# Patient Record
Sex: Female | Born: 1975 | Race: White | Hispanic: No | Marital: Single
Health system: Southern US, Community
[De-identification: ages and names within clinical notes are randomized; demographics above are authoritative.]

## PROBLEM LIST (undated history)

## (undated) DIAGNOSIS — S7291XA Unspecified fracture of right femur, initial encounter for closed fracture: Secondary | ICD-10-CM

## (undated) DIAGNOSIS — S42202A Unspecified fracture of upper end of left humerus, initial encounter for closed fracture: Secondary | ICD-10-CM

## (undated) DIAGNOSIS — S52371A Galeazzi's fracture of right radius, initial encounter for closed fracture: Secondary | ICD-10-CM

---
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
## 2019-02-09 ENCOUNTER — Emergency Department (HOSPITAL_COMMUNITY): Payer: PRIVATE HEALTH INSURANCE

## 2019-02-09 ENCOUNTER — Inpatient Hospital Stay (HOSPITAL_COMMUNITY)
Admission: EM | Admit: 2019-02-09 | Discharge: 2019-02-19 | DRG: 956 | Disposition: A | Discharge status: Rehabilitation Facility | Payer: PRIVATE HEALTH INSURANCE | Attending: General Surgery | Admitting: General Surgery

## 2019-02-09 ENCOUNTER — Inpatient Hospital Stay (HOSPITAL_COMMUNITY): Payer: PRIVATE HEALTH INSURANCE | Admitting: Certified Registered Nurse Anesthetist

## 2019-02-09 ENCOUNTER — Inpatient Hospital Stay (HOSPITAL_COMMUNITY): Payer: PRIVATE HEALTH INSURANCE

## 2019-02-09 ENCOUNTER — Encounter (HOSPITAL_COMMUNITY): Payer: Self-pay | Admitting: Certified Registered"

## 2019-02-09 ENCOUNTER — Encounter (HOSPITAL_COMMUNITY): Admission: EM | Disposition: A | Discharge status: Rehabilitation Facility | Payer: Self-pay | Source: Home / Self Care

## 2019-02-09 ENCOUNTER — Other Ambulatory Visit: Payer: Self-pay

## 2019-02-09 DIAGNOSIS — Z6841 Body Mass Index (BMI) 40.0 and over, adult: Secondary | ICD-10-CM | POA: Diagnosis not present

## 2019-02-09 DIAGNOSIS — S52371A Galeazzi's fracture of right radius, initial encounter for closed fracture: Secondary | ICD-10-CM | POA: Diagnosis present

## 2019-02-09 DIAGNOSIS — R0902 Hypoxemia: Secondary | ICD-10-CM

## 2019-02-09 DIAGNOSIS — Z20828 Contact with and (suspected) exposure to other viral communicable diseases: Secondary | ICD-10-CM | POA: Diagnosis present

## 2019-02-09 DIAGNOSIS — S72491A Other fracture of lower end of right femur, initial encounter for closed fracture: Principal | ICD-10-CM | POA: Diagnosis present

## 2019-02-09 DIAGNOSIS — S0003XA Contusion of scalp, initial encounter: Secondary | ICD-10-CM | POA: Diagnosis present

## 2019-02-09 DIAGNOSIS — S36892A Contusion of other intra-abdominal organs, initial encounter: Secondary | ICD-10-CM | POA: Diagnosis present

## 2019-02-09 DIAGNOSIS — S272XXA Traumatic hemopneumothorax, initial encounter: Secondary | ICD-10-CM | POA: Diagnosis present

## 2019-02-09 DIAGNOSIS — I2699 Other pulmonary embolism without acute cor pulmonale: Secondary | ICD-10-CM | POA: Diagnosis not present

## 2019-02-09 DIAGNOSIS — R110 Nausea: Secondary | ICD-10-CM | POA: Diagnosis not present

## 2019-02-09 DIAGNOSIS — E8889 Other specified metabolic disorders: Secondary | ICD-10-CM | POA: Diagnosis present

## 2019-02-09 DIAGNOSIS — S0081XA Abrasion of other part of head, initial encounter: Secondary | ICD-10-CM | POA: Diagnosis present

## 2019-02-09 DIAGNOSIS — S42111A Displaced fracture of body of scapula, right shoulder, initial encounter for closed fracture: Secondary | ICD-10-CM | POA: Diagnosis present

## 2019-02-09 DIAGNOSIS — R52 Pain, unspecified: Secondary | ICD-10-CM

## 2019-02-09 DIAGNOSIS — D6851 Activated protein C resistance: Secondary | ICD-10-CM | POA: Diagnosis not present

## 2019-02-09 DIAGNOSIS — S36113A Laceration of liver, unspecified degree, initial encounter: Secondary | ICD-10-CM | POA: Diagnosis present

## 2019-02-09 DIAGNOSIS — S2241XA Multiple fractures of ribs, right side, initial encounter for closed fracture: Secondary | ICD-10-CM | POA: Diagnosis present

## 2019-02-09 DIAGNOSIS — Z79899 Other long term (current) drug therapy: Secondary | ICD-10-CM | POA: Diagnosis not present

## 2019-02-09 DIAGNOSIS — Z7901 Long term (current) use of anticoagulants: Secondary | ICD-10-CM | POA: Diagnosis not present

## 2019-02-09 DIAGNOSIS — E559 Vitamin D deficiency, unspecified: Secondary | ICD-10-CM | POA: Diagnosis present

## 2019-02-09 DIAGNOSIS — N920 Excessive and frequent menstruation with regular cycle: Secondary | ICD-10-CM | POA: Diagnosis not present

## 2019-02-09 DIAGNOSIS — S27321A Contusion of lung, unilateral, initial encounter: Secondary | ICD-10-CM | POA: Diagnosis present

## 2019-02-09 DIAGNOSIS — S42201A Unspecified fracture of upper end of right humerus, initial encounter for closed fracture: Principal | ICD-10-CM

## 2019-02-09 DIAGNOSIS — S270XXA Traumatic pneumothorax, initial encounter: Secondary | ICD-10-CM

## 2019-02-09 DIAGNOSIS — Z419 Encounter for procedure for purposes other than remedying health state, unspecified: Secondary | ICD-10-CM

## 2019-02-09 DIAGNOSIS — D62 Acute posthemorrhagic anemia: Secondary | ICD-10-CM | POA: Diagnosis not present

## 2019-02-09 DIAGNOSIS — S42202A Unspecified fracture of upper end of left humerus, initial encounter for closed fracture: Secondary | ICD-10-CM | POA: Diagnosis present

## 2019-02-09 DIAGNOSIS — S42292A Other displaced fracture of upper end of left humerus, initial encounter for closed fracture: Secondary | ICD-10-CM | POA: Diagnosis present

## 2019-02-09 DIAGNOSIS — S80812A Abrasion, left lower leg, initial encounter: Secondary | ICD-10-CM | POA: Diagnosis present

## 2019-02-09 DIAGNOSIS — J9690 Respiratory failure, unspecified, unspecified whether with hypoxia or hypercapnia: Secondary | ICD-10-CM | POA: Diagnosis present

## 2019-02-09 DIAGNOSIS — G373 Acute transverse myelitis in demyelinating disease of central nervous system: Secondary | ICD-10-CM | POA: Diagnosis present

## 2019-02-09 DIAGNOSIS — S7291XA Unspecified fracture of right femur, initial encounter for closed fracture: Secondary | ICD-10-CM | POA: Diagnosis present

## 2019-02-09 DIAGNOSIS — T148XXA Other injury of unspecified body region, initial encounter: Secondary | ICD-10-CM

## 2019-02-09 HISTORY — PX: ORIF ULNAR FRACTURE: SHX5417

## 2019-02-09 HISTORY — PX: FEMUR IM NAIL: SHX1597

## 2019-02-09 LAB — URINALYSIS, ROUTINE W REFLEX MICROSCOPIC
Bilirubin Urine: NEGATIVE
Glucose, UA: NEGATIVE mg/dL
Ketones, ur: NEGATIVE mg/dL
Leukocytes,Ua: NEGATIVE
Nitrite: POSITIVE — AB
Protein, ur: 100 mg/dL — AB
Specific Gravity, Urine: >1.046 — ABNORMAL HIGH (ref 1.005–1.030)
pH: 5 (ref 5.0–8.0)

## 2019-02-09 LAB — COMPREHENSIVE METABOLIC PANEL
ALT: 193 U/L — ABNORMAL HIGH (ref 0–44)
AST: 255 U/L — ABNORMAL HIGH (ref 15–41)
Albumin: 3.2 g/dL — ABNORMAL LOW (ref 3.5–5.0)
Alkaline Phosphatase: 70 U/L (ref 38–126)
Anion gap: 10 (ref 5–15)
BUN: 15 mg/dL (ref 6–20)
CO2: 21 mmol/L — ABNORMAL LOW (ref 22–32)
Calcium: 9.1 mg/dL (ref 8.9–10.3)
Chloride: 107 mmol/L (ref 98–111)
Creatinine, Ser: 0.79 mg/dL (ref 0.44–1.00)
GFR calc Af Amer: >60 mL/min (ref >60)
GFR calc non Af Amer: >60 mL/min (ref >60)
Glucose, Bld: 143 mg/dL — ABNORMAL HIGH (ref 70–99)
Potassium: 4.3 mmol/L (ref 3.5–5.1)
Sodium: 138 mmol/L (ref 135–145)
Total Bilirubin: 0.2 mg/dL — ABNORMAL LOW (ref 0.3–1.2)
Total Protein: 6.5 g/dL (ref 6.5–8.1)

## 2019-02-09 LAB — CBC
HCT: 38.9 % (ref 36.0–46.0)
HCT: 37 % (ref 36.0–46.0)
HCT: 35.6 % — ABNORMAL LOW (ref 36.0–46.0)
Hemoglobin: 12.3 g/dL (ref 12.0–15.0)
Hemoglobin: 11.6 g/dL — ABNORMAL LOW (ref 12.0–15.0)
Hemoglobin: 10.9 g/dL — ABNORMAL LOW (ref 12.0–15.0)
MCH: 27 pg (ref 26.0–34.0)
MCH: 27 pg (ref 26.0–34.0)
MCH: 26.5 pg (ref 26.0–34.0)
MCHC: 31.6 g/dL (ref 30.0–36.0)
MCHC: 31.4 g/dL (ref 30.0–36.0)
MCHC: 30.6 g/dL (ref 30.0–36.0)
MCV: 85.5 fL (ref 80.0–100.0)
MCV: 86 fL (ref 80.0–100.0)
MCV: 86.4 fL (ref 80.0–100.0)
Platelets: 320 10*3/uL (ref 150–400)
Platelets: 304 10*3/uL (ref 150–400)
Platelets: 233 10*3/uL (ref 150–400)
RBC: 4.55 MIL/uL (ref 3.87–5.11)
RBC: 4.3 MIL/uL (ref 3.87–5.11)
RBC: 4.12 MIL/uL (ref 3.87–5.11)
RDW: 13.4 % (ref 11.5–15.5)
RDW: 13.5 % (ref 11.5–15.5)
RDW: 13.7 % (ref 11.5–15.5)
WBC: 26 10*3/uL — ABNORMAL HIGH (ref 4.0–10.5)
WBC: 17.2 10*3/uL — ABNORMAL HIGH (ref 4.0–10.5)
WBC: 14.4 10*3/uL — ABNORMAL HIGH (ref 4.0–10.5)
nRBC: 0 % (ref 0.0–0.2)
nRBC: 0 % (ref 0.0–0.2)
nRBC: 0 % (ref 0.0–0.2)

## 2019-02-09 LAB — I-STAT CHEM 8, ED
BUN: 16 mg/dL (ref 6–20)
Calcium, Ion: 1.05 mmol/L — ABNORMAL LOW (ref 1.15–1.40)
Chloride: 106 mmol/L (ref 98–111)
Creatinine, Ser: 0.6 mg/dL (ref 0.44–1.00)
Glucose, Bld: 136 mg/dL — ABNORMAL HIGH (ref 70–99)
HCT: 38 % (ref 36.0–46.0)
Hemoglobin: 12.9 g/dL (ref 12.0–15.0)
Potassium: 4.2 mmol/L (ref 3.5–5.1)
Sodium: 138 mmol/L (ref 135–145)
TCO2: 20 mmol/L — ABNORMAL LOW (ref 22–32)

## 2019-02-09 LAB — PROTIME-INR
INR: 1 (ref 0.8–1.2)
Prothrombin Time: 13.5 seconds (ref 11.4–15.2)

## 2019-02-09 LAB — HIV ANTIBODY (ROUTINE TESTING W REFLEX): HIV Screen 4th Generation wRfx: NONREACTIVE

## 2019-02-09 LAB — LACTIC ACID, PLASMA
Lactic Acid, Venous: 2.2 mmol/L (ref 0.5–1.9)
Lactic Acid, Venous: 2.9 mmol/L (ref 0.5–1.9)

## 2019-02-09 LAB — SAMPLE TO BLOOD BANK

## 2019-02-09 LAB — I-STAT BETA HCG BLOOD, ED (MC, WL, AP ONLY): I-stat hCG, quantitative: <5 m[IU]/mL (ref <5)

## 2019-02-09 LAB — CDS SEROLOGY

## 2019-02-09 LAB — SARS CORONAVIRUS 2 BY RT PCR (HOSPITAL ORDER, PERFORMED IN ~~LOC~~ HOSPITAL LAB): SARS Coronavirus 2: NEGATIVE

## 2019-02-09 SURGERY — INSERTION, INTRAMEDULLARY ROD, FEMUR, RETROGRADE
Anesthesia: General | Site: Leg Upper | Laterality: Right

## 2019-02-09 MED ORDER — ONDANSETRON HCL 4 MG/2ML IJ SOLN
4.0000 mg | Freq: Four times a day (QID) | INTRAMUSCULAR | Status: DC | PRN
  Administered 2019-02-09 – 2019-02-10 (×3): 4 mg via INTRAVENOUS
  Filled 2019-02-09 (×3): qty 2

## 2019-02-09 MED ORDER — PROPOFOL 10 MG/ML IV BOLUS
INTRAVENOUS | Status: DC | PRN
  Administered 2019-02-09: 180 mg via INTRAVENOUS
  Administered 2019-02-09: 20 mg via INTRAVENOUS

## 2019-02-09 MED ORDER — SCOPOLAMINE 1 MG/3DAYS TD PT72
MEDICATED_PATCH | TRANSDERMAL | Status: DC | PRN
  Administered 2019-02-09: 1 via TRANSDERMAL

## 2019-02-09 MED ORDER — LIDOCAINE 2% (20 MG/ML) 5 ML SYRINGE
INTRAMUSCULAR | Status: AC
  Filled 2019-02-09: qty 10

## 2019-02-09 MED ORDER — ROCURONIUM BROMIDE 50 MG/5ML IV SOSY
PREFILLED_SYRINGE | INTRAVENOUS | Status: DC | PRN
  Administered 2019-02-09: 40 mg via INTRAVENOUS
  Administered 2019-02-09: 60 mg via INTRAVENOUS

## 2019-02-09 MED ORDER — METHOCARBAMOL 500 MG PO TABS
1000.0000 mg | ORAL_TABLET | Freq: Three times a day (TID) | ORAL | Status: DC
  Administered 2019-02-09 – 2019-02-19 (×30): 1000 mg via ORAL
  Filled 2019-02-09 (×31): qty 2

## 2019-02-09 MED ORDER — ACETAMINOPHEN 500 MG PO TABS
1000.0000 mg | ORAL_TABLET | Freq: Once | ORAL | Status: AC
  Administered 2019-02-09: 1000 mg via ORAL
  Filled 2019-02-09: qty 2

## 2019-02-09 MED ORDER — CEFAZOLIN SODIUM-DEXTROSE 2-4 GM/100ML-% IV SOLN
2.0000 g | Freq: Three times a day (TID) | INTRAVENOUS | Status: AC
  Administered 2019-02-10 (×3): 2 g via INTRAVENOUS
  Filled 2019-02-09 (×3): qty 100

## 2019-02-09 MED ORDER — 0.9 % SODIUM CHLORIDE (POUR BTL) OPTIME
TOPICAL | Status: DC | PRN
  Administered 2019-02-09: 1000 mL

## 2019-02-09 MED ORDER — ONDANSETRON HCL 4 MG/2ML IJ SOLN
4.0000 mg | Freq: Once | INTRAMUSCULAR | Status: AC
  Administered 2019-02-09: 4 mg via INTRAVENOUS
  Filled 2019-02-09: qty 2

## 2019-02-09 MED ORDER — ACETAMINOPHEN 10 MG/ML IV SOLN
INTRAVENOUS | Status: AC
  Filled 2019-02-09: qty 100

## 2019-02-09 MED ORDER — FENTANYL CITRATE (PF) 250 MCG/5ML IJ SOLN
INTRAMUSCULAR | Status: AC
  Filled 2019-02-09: qty 5

## 2019-02-09 MED ORDER — PROPOFOL 10 MG/ML IV BOLUS
INTRAVENOUS | Status: AC
  Filled 2019-02-09: qty 20

## 2019-02-09 MED ORDER — FENTANYL CITRATE (PF) 100 MCG/2ML IJ SOLN
INTRAMUSCULAR | Status: DC | PRN
  Administered 2019-02-09: 150 ug via INTRAVENOUS
  Administered 2019-02-09 (×2): 50 ug via INTRAVENOUS
  Administered 2019-02-09: 100 ug via INTRAVENOUS
  Administered 2019-02-09: 50 ug via INTRAVENOUS
  Administered 2019-02-09: 100 ug via INTRAVENOUS

## 2019-02-09 MED ORDER — TETANUS-DIPHTH-ACELL PERTUSSIS 5-2.5-18.5 LF-MCG/0.5 IM SUSP
0.5000 mL | Freq: Once | INTRAMUSCULAR | Status: AC
  Administered 2019-02-09: 0.5 mL via INTRAMUSCULAR
  Filled 2019-02-09: qty 0.5

## 2019-02-09 MED ORDER — DIPHENHYDRAMINE HCL 50 MG/ML IJ SOLN
INTRAMUSCULAR | Status: DC | PRN
  Administered 2019-02-09: 25 mg via INTRAVENOUS

## 2019-02-09 MED ORDER — FENTANYL CITRATE (PF) 100 MCG/2ML IJ SOLN
50.0000 ug | INTRAMUSCULAR | Status: AC | PRN
  Administered 2019-02-09 (×3): 50 ug via INTRAVENOUS
  Filled 2019-02-09 (×3): qty 2

## 2019-02-09 MED ORDER — MIDAZOLAM HCL 2 MG/2ML IJ SOLN
INTRAMUSCULAR | Status: AC
  Filled 2019-02-09: qty 2

## 2019-02-09 MED ORDER — METOPROLOL TARTRATE 5 MG/5ML IV SOLN: 5.0000 mg | Freq: Four times a day (QID) | INTRAVENOUS | Status: DC | PRN

## 2019-02-09 MED ORDER — MIDAZOLAM HCL 5 MG/5ML IJ SOLN
INTRAMUSCULAR | Status: DC | PRN
  Administered 2019-02-09: 2 mg via INTRAVENOUS

## 2019-02-09 MED ORDER — SODIUM CHLORIDE 0.9 % IV BOLUS
1000.0000 mL | Freq: Once | INTRAVENOUS | Status: AC
  Administered 2019-02-09: 1000 mL via INTRAVENOUS

## 2019-02-09 MED ORDER — PROMETHAZINE HCL 25 MG/ML IJ SOLN
INTRAMUSCULAR | Status: AC
  Administered 2019-02-09: 6.25 mg via INTRAVENOUS
  Filled 2019-02-09: qty 1

## 2019-02-09 MED ORDER — CALCIUM GLUCONATE-NACL 1-0.675 GM/50ML-% IV SOLN
1.0000 g | Freq: Once | INTRAVENOUS | Status: AC
  Administered 2019-02-09: 1000 mg via INTRAVENOUS
  Filled 2019-02-09: qty 50

## 2019-02-09 MED ORDER — ONDANSETRON HCL 4 MG/2ML IJ SOLN
INTRAMUSCULAR | Status: DC | PRN
  Administered 2019-02-09: 4 mg via INTRAVENOUS

## 2019-02-09 MED ORDER — ONDANSETRON 4 MG PO TBDP
4.0000 mg | ORAL_TABLET | Freq: Four times a day (QID) | ORAL | Status: DC | PRN
  Administered 2019-02-10 – 2019-02-13 (×7): 4 mg via ORAL
  Filled 2019-02-09 (×7): qty 1

## 2019-02-09 MED ORDER — DEXAMETHASONE SODIUM PHOSPHATE 10 MG/ML IJ SOLN
INTRAMUSCULAR | Status: DC | PRN
  Administered 2019-02-09: 10 mg via INTRAVENOUS

## 2019-02-09 MED ORDER — PROMETHAZINE HCL 25 MG/ML IJ SOLN
6.2500 mg | INTRAMUSCULAR | Status: AC | PRN
  Administered 2019-02-09 (×2): 6.25 mg via INTRAVENOUS

## 2019-02-09 MED ORDER — ONDANSETRON HCL 4 MG/2ML IJ SOLN
INTRAMUSCULAR | Status: AC
  Filled 2019-02-09: qty 2

## 2019-02-09 MED ORDER — KCL IN DEXTROSE-NACL 20-5-0.45 MEQ/L-%-% IV SOLN
INTRAVENOUS | Status: DC
  Administered 2019-02-09 – 2019-02-11 (×6): via INTRAVENOUS
  Filled 2019-02-09 (×9): qty 1000

## 2019-02-09 MED ORDER — DEXAMETHASONE SODIUM PHOSPHATE 10 MG/ML IJ SOLN
INTRAMUSCULAR | Status: AC
  Filled 2019-02-09: qty 1

## 2019-02-09 MED ORDER — LACTATED RINGERS IV SOLN
INTRAVENOUS | Status: DC
  Administered 2019-02-14: 07:00:00 via INTRAVENOUS

## 2019-02-09 MED ORDER — FENTANYL CITRATE (PF) 100 MCG/2ML IJ SOLN: 25.0000 ug | INTRAMUSCULAR | Status: DC | PRN

## 2019-02-09 MED ORDER — IBUPROFEN 800 MG PO TABS
800.0000 mg | ORAL_TABLET | Freq: Three times a day (TID) | ORAL | Status: AC
  Administered 2019-02-09 – 2019-02-12 (×8): 800 mg via ORAL
  Filled 2019-02-09: qty 4
  Filled 2019-02-09: qty 1
  Filled 2019-02-09 (×2): qty 4
  Filled 2019-02-09 (×2): qty 1
  Filled 2019-02-09 (×3): qty 4
  Filled 2019-02-09 (×2): qty 1
  Filled 2019-02-09: qty 4
  Filled 2019-02-09 (×3): qty 1

## 2019-02-09 MED ORDER — DEXTROSE 5 % IV SOLN
3.0000 g | Freq: Once | INTRAVENOUS | Status: AC
  Administered 2019-02-09: 19:00:00 3 g via INTRAVENOUS
  Filled 2019-02-09: qty 3000

## 2019-02-09 MED ORDER — OXYCODONE HCL 5 MG PO TABS
5.0000 mg | ORAL_TABLET | ORAL | Status: DC | PRN
  Administered 2019-02-12 – 2019-02-15 (×9): 10 mg
  Administered 2019-02-15: 5 mg
  Administered 2019-02-15 – 2019-02-19 (×17): 10 mg
  Filled 2019-02-09 (×9): qty 2
  Filled 2019-02-09: qty 1
  Filled 2019-02-09 (×20): qty 2

## 2019-02-09 MED ORDER — METOCLOPRAMIDE HCL 5 MG/ML IJ SOLN
INTRAMUSCULAR | Status: AC
  Filled 2019-02-09: qty 2

## 2019-02-09 MED ORDER — DOCUSATE SODIUM 100 MG PO CAPS
100.0000 mg | ORAL_CAPSULE | Freq: Two times a day (BID) | ORAL | Status: DC
  Administered 2019-02-09 – 2019-02-19 (×19): 100 mg via ORAL
  Filled 2019-02-09 (×19): qty 1

## 2019-02-09 MED ORDER — SUGAMMADEX SODIUM 200 MG/2ML IV SOLN
INTRAVENOUS | Status: DC | PRN
  Administered 2019-02-09: 200 mg via INTRAVENOUS

## 2019-02-09 MED ORDER — IOHEXOL 300 MG/ML  SOLN
100.0000 mL | Freq: Once | INTRAMUSCULAR | Status: AC
  Administered 2019-02-09: 100 mL via INTRAVENOUS

## 2019-02-09 MED ORDER — ROCURONIUM BROMIDE 10 MG/ML (PF) SYRINGE
PREFILLED_SYRINGE | INTRAVENOUS | Status: AC
  Filled 2019-02-09: qty 20

## 2019-02-09 MED ORDER — METOCLOPRAMIDE HCL 5 MG/ML IJ SOLN
10.0000 mg | Freq: Once | INTRAMUSCULAR | Status: AC
  Administered 2019-02-09: 10 mg via INTRAVENOUS

## 2019-02-09 MED ORDER — MORPHINE SULFATE (PF) 4 MG/ML IV SOLN
4.0000 mg | INTRAVENOUS | Status: DC | PRN
  Administered 2019-02-09 – 2019-02-11 (×9): 4 mg via INTRAVENOUS
  Filled 2019-02-09 (×9): qty 1

## 2019-02-09 MED ORDER — LACTATED RINGERS IV SOLN
INTRAVENOUS | Status: DC | PRN
  Administered 2019-02-09 (×3): via INTRAVENOUS

## 2019-02-09 MED ORDER — LACTATED RINGERS IV BOLUS: 1000.0000 mL | Freq: Once | INTRAVENOUS | Status: DC

## 2019-02-09 MED ORDER — LIDOCAINE 2% (20 MG/ML) 5 ML SYRINGE
INTRAMUSCULAR | Status: DC | PRN
  Administered 2019-02-09: 80 mg via INTRAVENOUS

## 2019-02-09 SURGICAL SUPPLY — 84 items
BANDAGE ELASTIC 3 VELCRO ST LF (GAUZE/BANDAGES/DRESSINGS) ×3 IMPLANT
BIT DRILL 2.8 QR W/DEPTH MARKS (BIT) ×3 IMPLANT
BIT DRILL CALIBRATED 4.3MMX365 (DRILL) ×2 IMPLANT
BIT DRILL CROWE PNT TWST 4.5MM (DRILL) ×4 IMPLANT
BLADE SURG 15 STRL LF DISP TIS (BLADE) ×2 IMPLANT
BLADE SURG 15 STRL SS (BLADE) ×1
BNDG COHESIVE 4X5 TAN STRL (GAUZE/BANDAGES/DRESSINGS) ×3 IMPLANT
BNDG ELASTIC 4X5.8 VLCR STR LF (GAUZE/BANDAGES/DRESSINGS) ×3 IMPLANT
BNDG ELASTIC 6X5.8 VLCR STR LF (GAUZE/BANDAGES/DRESSINGS) IMPLANT
BNDG ESMARK 4X9 LF (GAUZE/BANDAGES/DRESSINGS) ×3 IMPLANT
BNDG GAUZE ELAST 4 BULKY (GAUZE/BANDAGES/DRESSINGS) ×3 IMPLANT
BONE CHIP PRESERV 40CC PCAN1/2 (Bone Implant) ×3 IMPLANT
BRUSH SCRUB EZ PLAIN DRY (MISCELLANEOUS) ×9 IMPLANT
CABLE BIPOLOR RESECTION CORD (MISCELLANEOUS) IMPLANT
COVER LIGHT HANDLE STERIS (MISCELLANEOUS) ×6 IMPLANT
COVER SURGICAL LIGHT HANDLE (MISCELLANEOUS) ×6 IMPLANT
COVER WAND RF STERILE (DRAPES) ×3 IMPLANT
CUFF TOURN SGL QUICK 18X4 (TOURNIQUET CUFF) ×3 IMPLANT
DRAPE C-ARM 42X72 X-RAY (DRAPES) ×6 IMPLANT
DRAPE C-ARMOR (DRAPES) ×3 IMPLANT
DRAPE IMP U-DRAPE 54X76 (DRAPES) ×3 IMPLANT
DRAPE INCISE IOBAN 66X45 STRL (DRAPES) ×3 IMPLANT
DRAPE ORTHO SPLIT 77X108 STRL (DRAPES) ×2
DRAPE SURG 17X23 STRL (DRAPES) ×3 IMPLANT
DRAPE SURG ORHT 6 SPLT 77X108 (DRAPES) ×4 IMPLANT
DRAPE U-SHAPE 47X51 STRL (DRAPES) ×3 IMPLANT
DRILL CALIBRATED 4.3MMX365 (DRILL) ×3
DRILL CROWE POINT TWIST 4.5MM (DRILL) ×6
DRSG MEPILEX BORDER 4X4 (GAUZE/BANDAGES/DRESSINGS) ×3 IMPLANT
DRSG MEPILEX BORDER 4X8 (GAUZE/BANDAGES/DRESSINGS) ×3 IMPLANT
ELECT REM PT RETURN 9FT ADLT (ELECTROSURGICAL) ×3
ELECTRODE REM PT RTRN 9FT ADLT (ELECTROSURGICAL) ×2 IMPLANT
EVACUATOR 1/8 PVC DRAIN (DRAIN) IMPLANT
GAUZE SPONGE 4X4 12PLY STRL (GAUZE/BANDAGES/DRESSINGS) IMPLANT
GAUZE SPONGE 4X4 12PLY STRL LF (GAUZE/BANDAGES/DRESSINGS) ×3 IMPLANT
GAUZE XEROFORM 1X8 LF (GAUZE/BANDAGES/DRESSINGS) IMPLANT
GLOVE BIO SURGEON STRL SZ7.5 (GLOVE) ×6 IMPLANT
GLOVE BIO SURGEON STRL SZ8 (GLOVE) ×12 IMPLANT
GLOVE BIOGEL PI IND STRL 7.0 (GLOVE) ×2 IMPLANT
GLOVE BIOGEL PI IND STRL 7.5 (GLOVE) ×4 IMPLANT
GLOVE BIOGEL PI IND STRL 8 (GLOVE) ×4 IMPLANT
GLOVE BIOGEL PI INDICATOR 7.0 (GLOVE) ×1
GLOVE BIOGEL PI INDICATOR 7.5 (GLOVE) ×2
GLOVE BIOGEL PI INDICATOR 8 (GLOVE) ×2
GLOVE ECLIPSE 6.5 STRL STRAW (GLOVE) ×3 IMPLANT
GOWN STRL REUS W/ TWL LRG LVL3 (GOWN DISPOSABLE) ×10 IMPLANT
GOWN STRL REUS W/ TWL XL LVL3 (GOWN DISPOSABLE) ×8 IMPLANT
GOWN STRL REUS W/TWL LRG LVL3 (GOWN DISPOSABLE) ×5
GOWN STRL REUS W/TWL XL LVL3 (GOWN DISPOSABLE) ×4
GUIDEPIN 3.2X17.5 THRD DISP (PIN) ×3 IMPLANT
GUIDEWIRE BEAD TIP (WIRE) ×3 IMPLANT
KIT BASIN OR (CUSTOM PROCEDURE TRAY) ×3 IMPLANT
KIT TURNOVER KIT B (KITS) ×3 IMPLANT
MARKER SKIN DUAL TIP RULER LAB (MISCELLANEOUS) ×3 IMPLANT
NAIL FEM RETRO 10.5X360 (Nail) ×3 IMPLANT
PACK ORTHO EXTREMITY (CUSTOM PROCEDURE TRAY) ×6 IMPLANT
PACK UNIVERSAL I (CUSTOM PROCEDURE TRAY) ×3 IMPLANT
PAD ARMBOARD 7.5X6 YLW CONV (MISCELLANEOUS) ×6 IMPLANT
PAD CAST 3X4 CTTN HI CHSV (CAST SUPPLIES) ×2 IMPLANT
PAD CAST 4YDX4 CTTN HI CHSV (CAST SUPPLIES) ×2 IMPLANT
PADDING CAST COTTON 3X4 STRL (CAST SUPPLIES) ×1
PADDING CAST COTTON 4X4 STRL (CAST SUPPLIES) ×1
PLATE 8 HOLE RADIUS (Plate) ×3 IMPLANT
SCREW CORT TI DBL LEAD 5X32 (Screw) ×3 IMPLANT
SCREW CORT TI DBL LEAD 5X34 (Screw) ×3 IMPLANT
SCREW CORT TI DBL LEAD 5X44 (Screw) ×3 IMPLANT
SCREW CORT TI DBL LEAD 5X60 (Screw) ×3 IMPLANT
SCREW CORT TI DBL LEAD 5X75 (Screw) ×3 IMPLANT
SCREW NON LOCK 3.5X10MM (Screw) ×3 IMPLANT
SCREW NONLOCK HEX 3.5X12 (Screw) ×18 IMPLANT
SLING ARM FOAM STRAP LRG (SOFTGOODS) ×3 IMPLANT
SPONGE LAP 18X18 RF (DISPOSABLE) ×3 IMPLANT
STAPLER VISISTAT 35W (STAPLE) ×3 IMPLANT
STOCKINETTE IMPERVIOUS LG (DRAPES) ×3 IMPLANT
SUCTION FRAZIER TIP 10 FR DISP (SUCTIONS) ×6 IMPLANT
SUT ETHILON 2 0 FS 18 (SUTURE) ×3 IMPLANT
SUT PROLENE 3 0 PS 2 (SUTURE) ×3 IMPLANT
SUT VIC AB 0 CT1 27 (SUTURE) ×2
SUT VIC AB 0 CT1 27XBRD ANBCTR (SUTURE) ×4 IMPLANT
SUT VIC AB 2-0 CT3 27 (SUTURE) ×6 IMPLANT
TOWEL GREEN STERILE (TOWEL DISPOSABLE) ×6 IMPLANT
TOWEL GREEN STERILE FF (TOWEL DISPOSABLE) ×6 IMPLANT
TUBE CONNECTING 12X1/4 (SUCTIONS) ×6 IMPLANT
YANKAUER SUCT BULB TIP NO VENT (SUCTIONS) ×6 IMPLANT
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
## 2019-02-09 NOTE — ED Notes (Signed)
Dr.Handy at bedside 

## 2019-02-09 NOTE — Progress Notes (Signed)
Chaplain responded to L2 trauma page. Brandolyn was able to talk with medical staff and Chaplain. Chaplain prayed with [name] and kept her son Patricia Hill updated on her condition as well as the condition of Carmelite's mother. Chaplain promised Crystalina to remain with Patricia Hill until his grandfather arrived, due to him being shaken up and having ADHD. Patricia Hill was understandably concerned, but otherwise very kind and inquisitive. Once Grandpa arrived, Chaplain had security assist them in taking their things to the vehicle and Patricia Hill was permitted to sit with his mom while Grandpa sat with Grandma. Chaplain remains available for support as needed.   Chaplain Resident, Evelene Croon, Jerilynn [facility] Pager # [phone] on-call

## 2019-02-09 NOTE — ED Notes (Signed)
IV team at bedside 

## 2019-02-09 NOTE — ED Notes (Signed)
Pts son, Grayce Sessions, helped her sign the consent form for her expected surgery.

## 2019-02-09 NOTE — Transfer of Care (Signed)
Immediate Anesthesia Transfer of Care Note  Patient: Patricia Hill  Procedure(s) Performed: INTRAMEDULLARY (IM) RETROGRADE FEMORAL NAILING (Right Leg Upper) Open Reduction Internal Fixation (Orif) of Rigfht Radial Fracture (Right Arm Lower)  Patient Location: PACU  Anesthesia Type:General  Level of Consciousness: awake, alert  and oriented  Airway & Oxygen Therapy: Patient Spontanous Breathing and Patient connected to face mask oxygen  Post-op Assessment: Report given to RN and Post -op Vital signs reviewed and stable  Post vital signs: Reviewed and stable  Last Vitals:  Vitals Value Taken Time  BP 153/77 02/09/19 2250  Temp    Pulse 95 02/09/19 2252  Resp 18 02/09/19 2252  SpO2 98 % 02/09/19 2252  Vitals shown include unvalidated device data.  Last Pain:  Vitals:   02/09/19 1547  PainSc: 9          Complications: No apparent anesthesia complications
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
## 2019-02-09 NOTE — OR Nursing (Signed)
Left calf was cleaned with chlorhexidine. Left calf was then redressed with  mepatel, four by fours , and Kerlix by Dr. Marcelino Scot.

## 2019-02-09 NOTE — Anesthesia Procedure Notes (Signed)
Procedure Name: Intubation Date/Time: 02/09/2019 6:38 PM Performed by: Moshe Salisbury, CRNA Pre-anesthesia Checklist: Patient identified, Emergency Drugs available, Suction available and Patient being monitored Patient Re-evaluated:Patient Re-evaluated prior to induction Oxygen Delivery Method: Circle System Utilized Preoxygenation: Pre-oxygenation with 100% oxygen Induction Type: IV induction Ventilation: Mask ventilation without difficulty Laryngoscope Size: Glidescope and 4 Tube type: Oral Tube size: 7.0 mm Number of attempts: 1 Airway Equipment and Method: Stylet Placement Confirmation: ETT inserted through vocal cords under direct vision,  positive ETCO2 and breath sounds checked- equal and bilateral Secured at: 20 cm Tube secured with: Tape Dental Injury: Teeth and Oropharynx as per pre-operative assessment

## 2019-02-09 NOTE — Progress Notes (Signed)
Patient arrived to Short Stay alert and oriented x 4. Patient placed on pulse ox and 2 lpm oxygen (continued from ED).

## 2019-02-09 NOTE — ED Notes (Signed)
Patient transported to CT 

## 2019-02-10 ENCOUNTER — Inpatient Hospital Stay (HOSPITAL_COMMUNITY): Payer: PRIVATE HEALTH INSURANCE

## 2019-02-10 DIAGNOSIS — I2699 Other pulmonary embolism without acute cor pulmonale: Secondary | ICD-10-CM

## 2019-02-10 LAB — BASIC METABOLIC PANEL
Anion gap: 10 (ref 5–15)
BUN: 11 mg/dL (ref 6–20)
CO2: 22 mmol/L (ref 22–32)
Calcium: 8.2 mg/dL — ABNORMAL LOW (ref 8.9–10.3)
Chloride: 105 mmol/L (ref 98–111)
Creatinine, Ser: 0.71 mg/dL (ref 0.44–1.00)
GFR calc Af Amer: >60 mL/min (ref >60)
GFR calc non Af Amer: >60 mL/min (ref >60)
Glucose, Bld: 195 mg/dL — ABNORMAL HIGH (ref 70–99)
Potassium: 4.5 mmol/L (ref 3.5–5.1)
Sodium: 137 mmol/L (ref 135–145)

## 2019-02-10 LAB — CBC
HCT: 32.4 % — ABNORMAL LOW (ref 36.0–46.0)
HCT: 28.1 % — ABNORMAL LOW (ref 36.0–46.0)
Hemoglobin: 10 g/dL — ABNORMAL LOW (ref 12.0–15.0)
Hemoglobin: 9.1 g/dL — ABNORMAL LOW (ref 12.0–15.0)
MCH: 26.5 pg (ref 26.0–34.0)
MCH: 27.3 pg (ref 26.0–34.0)
MCHC: 30.9 g/dL (ref 30.0–36.0)
MCHC: 32.4 g/dL (ref 30.0–36.0)
MCV: 85.7 fL (ref 80.0–100.0)
MCV: 84.4 fL (ref 80.0–100.0)
Platelets: 256 10*3/uL (ref 150–400)
Platelets: 221 10*3/uL (ref 150–400)
RBC: 3.78 MIL/uL — ABNORMAL LOW (ref 3.87–5.11)
RBC: 3.33 MIL/uL — ABNORMAL LOW (ref 3.87–5.11)
RDW: 13.5 % (ref 11.5–15.5)
RDW: 13.7 % (ref 11.5–15.5)
WBC: 10.9 10*3/uL — ABNORMAL HIGH (ref 4.0–10.5)
WBC: 11.7 10*3/uL — ABNORMAL HIGH (ref 4.0–10.5)
nRBC: 0 % (ref 0.0–0.2)
nRBC: 0 % (ref 0.0–0.2)

## 2019-02-10 LAB — MAGNESIUM: Magnesium: 1.9 mg/dL (ref 1.7–2.4)

## 2019-02-10 LAB — ETHANOL: Alcohol, Ethyl (B): <10 mg/dL (ref <10)

## 2019-02-10 LAB — VITAMIN D 25 HYDROXY (VIT D DEFICIENCY, FRACTURES): Vit D, 25-Hydroxy: 13.85 ng/mL — ABNORMAL LOW (ref 30–100)

## 2019-02-10 LAB — PHOSPHORUS: Phosphorus: 2.4 mg/dL — ABNORMAL LOW (ref 2.5–4.6)

## 2019-02-10 MED ORDER — IOHEXOL 350 MG/ML SOLN
100.0000 mL | Freq: Once | INTRAVENOUS | Status: AC | PRN
  Administered 2019-02-10: 100 mL via INTRAVENOUS

## 2019-02-10 MED ORDER — VITAMIN C 500 MG PO TABS
500.0000 mg | ORAL_TABLET | Freq: Every day | ORAL | Status: DC
  Administered 2019-02-11: 500 mg via ORAL
  Filled 2019-02-10: qty 1

## 2019-02-10 MED ORDER — SODIUM CHLORIDE 0.9% FLUSH
10.0000 mL | INTRAVENOUS | Status: DC | PRN
  Administered 2019-02-10: 10 mL
  Filled 2019-02-10: qty 40

## 2019-02-10 MED ORDER — VITAMIN D 25 MCG (1000 UNIT) PO TABS
2000.0000 [IU] | ORAL_TABLET | Freq: Two times a day (BID) | ORAL | Status: DC
  Administered 2019-02-10 – 2019-02-19 (×17): 2000 [IU] via ORAL
  Filled 2019-02-10 (×18): qty 2

## 2019-02-10 MED ORDER — CHLORHEXIDINE GLUCONATE CLOTH 2 % EX PADS
6.0000 | MEDICATED_PAD | Freq: Every day | CUTANEOUS | Status: DC
  Administered 2019-02-10 – 2019-02-16 (×5): 6 via TOPICAL

## 2019-02-10 NOTE — Progress Notes (Signed)
Patient reports that oxycodone makes her vomit/nauseated. Will let provider and oncoming shift aware.

## 2019-02-10 NOTE — Progress Notes (Signed)
Call from Dr Bobbye Morton at [phone] to see if pt can go to CT scan, pt actively vomiting - no transport available at this time. Call back to CT at [phone] -CT scanner unavailable at this time  -4NP nurses updated. VS also not able to crossover from [place] 9 monitor, IT notified. VS entered manually
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
## 2019-02-10 NOTE — Anesthesia Postprocedure Evaluation (Signed)
Anesthesia Post Note  Patient: Patricia Hill  Procedure(s) Performed: INTRAMEDULLARY (IM) RETROGRADE FEMORAL NAILING (Right Leg Upper) Open Reduction Internal Fixation (Orif) of Rigfht Radial Fracture (Right Arm Lower)     Patient location during evaluation: PACU Anesthesia Type: General Level of consciousness: awake and alert Pain management: pain level controlled Vital Signs Assessment: post-procedure vital signs reviewed and stable Respiratory status: spontaneous breathing, nonlabored ventilation, respiratory function stable and patient connected to nasal cannula oxygen Cardiovascular status: blood pressure returned to baseline and stable Postop Assessment: no apparent nausea or vomiting Anesthetic complications: no    Last Vitals:  Vitals:   02/09/19 2350 02/10/19 0008  BP: (!) 167/70 (!) 157/78  Pulse: 77 75  Resp: (!) 21   Temp:  37.2 C  SpO2: 95% 96%    Last Pain:  Vitals:   02/10/19 0110  PainSc: 7                  Tiajuana Amass
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
## 2019-02-10 NOTE — Progress Notes (Signed)
LE venous duplex       has been completed. Preliminary results can be found under CV proc through chart review. Natarsha Hurwitz, BS, RDMS, RVT   

## 2019-02-10 NOTE — Progress Notes (Signed)
Dr. Illene Regulus paged about results of CT chest. Awaiting call back. New order for stat lower extremity venous noted.

## 2019-02-10 NOTE — Plan of Care (Signed)
  Problem: Education: Goal: Knowledge of General Education information will improve Description: Including pain rating scale, medication(s)/side effects and non-pharmacologic comfort measures Outcome: Progressing   Problem: Clinical Measurements: Goal: Will remain free from infection Outcome: Progressing   Problem: Clinical Measurements: Goal: Respiratory complications will improve Outcome: Progressing   Problem: Coping: Goal: Level of anxiety will decrease Outcome: Progressing   Problem: Pain Managment: Goal: General experience of comfort will improve Outcome: Progressing

## 2019-02-11 ENCOUNTER — Encounter (HOSPITAL_COMMUNITY): Payer: Self-pay | Admitting: Orthopedic Surgery

## 2019-02-11 DIAGNOSIS — S7291XA Unspecified fracture of right femur, initial encounter for closed fracture: Secondary | ICD-10-CM

## 2019-02-11 DIAGNOSIS — S42202A Unspecified fracture of upper end of left humerus, initial encounter for closed fracture: Secondary | ICD-10-CM | POA: Diagnosis present

## 2019-02-11 DIAGNOSIS — S52371A Galeazzi's fracture of right radius, initial encounter for closed fracture: Secondary | ICD-10-CM

## 2019-02-11 DIAGNOSIS — E559 Vitamin D deficiency, unspecified: Secondary | ICD-10-CM

## 2019-02-11 HISTORY — DX: Unspecified fracture of right femur, initial encounter for closed fracture: S72.91XA

## 2019-02-11 HISTORY — DX: Galeazzi's fracture of right radius, initial encounter for closed fracture: S52.371A

## 2019-02-11 HISTORY — DX: Unspecified fracture of upper end of left humerus, initial encounter for closed fracture: S42.202A

## 2019-02-11 LAB — BASIC METABOLIC PANEL WITH GFR
Anion gap: 8 (ref 5–15)
BUN: 7 mg/dL (ref 6–20)
CO2: 23 mmol/L (ref 22–32)
Calcium: 8.1 mg/dL — ABNORMAL LOW (ref 8.9–10.3)
Chloride: 105 mmol/L (ref 98–111)
Creatinine, Ser: 0.56 mg/dL (ref 0.44–1.00)
GFR calc Af Amer: >60 mL/min
GFR calc non Af Amer: >60 mL/min
Glucose, Bld: 102 mg/dL — ABNORMAL HIGH (ref 70–99)
Potassium: 4.4 mmol/L (ref 3.5–5.1)
Sodium: 136 mmol/L (ref 135–145)

## 2019-02-11 LAB — CBC
HCT: 27.3 % — ABNORMAL LOW (ref 36.0–46.0)
Hemoglobin: 8.5 g/dL — ABNORMAL LOW (ref 12.0–15.0)
MCH: 26.9 pg (ref 26.0–34.0)
MCHC: 31.1 g/dL (ref 30.0–36.0)
MCV: 86.4 fL (ref 80.0–100.0)
Platelets: 194 K/uL (ref 150–400)
RBC: 3.16 MIL/uL — ABNORMAL LOW (ref 3.87–5.11)
RDW: 13.6 % (ref 11.5–15.5)
WBC: 14 K/uL — ABNORMAL HIGH (ref 4.0–10.5)
nRBC: 0 % (ref 0.0–0.2)

## 2019-02-11 LAB — PHOSPHORUS: Phosphorus: 2.3 mg/dL — ABNORMAL LOW (ref 2.5–4.6)

## 2019-02-11 LAB — MRSA PCR SCREENING: MRSA by PCR: NEGATIVE

## 2019-02-11 LAB — MAGNESIUM: Magnesium: 1.8 mg/dL (ref 1.7–2.4)

## 2019-02-11 MED ORDER — GABAPENTIN 600 MG PO TABS
300.0000 mg | ORAL_TABLET | Freq: Three times a day (TID) | ORAL | Status: DC
  Administered 2019-02-11 – 2019-02-19 (×24): 300 mg via ORAL
  Filled 2019-02-11 (×24): qty 1

## 2019-02-11 MED ORDER — ENOXAPARIN SODIUM 40 MG/0.4ML ~~LOC~~ SOLN
40.0000 mg | SUBCUTANEOUS | Status: DC
  Administered 2019-02-11 – 2019-02-18 (×7): 40 mg via SUBCUTANEOUS
  Filled 2019-02-11 (×7): qty 0.4

## 2019-02-11 MED ORDER — MORPHINE SULFATE (PF) 2 MG/ML IV SOLN
2.0000 mg | INTRAVENOUS | Status: DC | PRN
  Administered 2019-02-11 – 2019-02-14 (×3): 2 mg via INTRAVENOUS
  Filled 2019-02-11 (×3): qty 1

## 2019-02-11 MED ORDER — ACETAMINOPHEN 325 MG PO TABS
650.0000 mg | ORAL_TABLET | Freq: Four times a day (QID) | ORAL | Status: DC
  Administered 2019-02-11 – 2019-02-19 (×32): 650 mg via ORAL
  Filled 2019-02-11 (×32): qty 2

## 2019-02-11 MED ORDER — LIDOCAINE 5 % EX PTCH
1.0000 | MEDICATED_PATCH | CUTANEOUS | Status: DC
  Administered 2019-02-12 (×2): 1 via TRANSDERMAL
  Filled 2019-02-11 (×2): qty 1

## 2019-02-11 MED ORDER — POLYETHYLENE GLYCOL 3350 17 G PO PACK
17.0000 g | PACK | Freq: Every day | ORAL | Status: DC
  Administered 2019-02-11: 17 g via ORAL
  Filled 2019-02-11: qty 1

## 2019-02-11 MED ORDER — VITAMIN D 25 MCG (1000 UNIT) PO TABS
2000.0000 [IU] | ORAL_TABLET | Freq: Two times a day (BID) | ORAL | Status: DC
  Administered 2019-02-11: 2000 [IU] via ORAL
  Filled 2019-02-11: qty 2

## 2019-02-11 MED ORDER — VITAMIN C 500 MG PO TABS
1000.0000 mg | ORAL_TABLET | Freq: Every day | ORAL | Status: DC
  Administered 2019-02-12 – 2019-02-19 (×7): 1000 mg via ORAL
  Filled 2019-02-11 (×7): qty 2

## 2019-02-11 NOTE — Progress Notes (Signed)
Patient educated on change in morphine dose and the doctor's wishes for her to try roxicodone combined with zofran to see how that manages pain. Patient receptive and agrees to try medication possibly later this evening if needed. Will pass information along to night nurse.
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
## 2019-02-12 ENCOUNTER — Encounter (HOSPITAL_COMMUNITY): Payer: Self-pay | Admitting: Orthopedic Surgery

## 2019-02-12 LAB — CBC
HCT: 27 % — ABNORMAL LOW (ref 36.0–46.0)
Hemoglobin: 8.3 g/dL — ABNORMAL LOW (ref 12.0–15.0)
MCH: 26.6 pg (ref 26.0–34.0)
MCHC: 30.7 g/dL (ref 30.0–36.0)
MCV: 86.5 fL (ref 80.0–100.0)
Platelets: 236 10*3/uL (ref 150–400)
RBC: 3.12 MIL/uL — ABNORMAL LOW (ref 3.87–5.11)
RDW: 13.7 % (ref 11.5–15.5)
WBC: 13.6 10*3/uL — ABNORMAL HIGH (ref 4.0–10.5)
nRBC: 0 % (ref 0.0–0.2)

## 2019-02-12 LAB — BASIC METABOLIC PANEL
Anion gap: 8 (ref 5–15)
BUN: 11 mg/dL (ref 6–20)
CO2: 24 mmol/L (ref 22–32)
Calcium: 8 mg/dL — ABNORMAL LOW (ref 8.9–10.3)
Chloride: 104 mmol/L (ref 98–111)
Creatinine, Ser: 0.67 mg/dL (ref 0.44–1.00)
GFR calc Af Amer: >60 mL/min (ref >60)
GFR calc non Af Amer: >60 mL/min (ref >60)
Glucose, Bld: 104 mg/dL — ABNORMAL HIGH (ref 70–99)
Potassium: 4.2 mmol/L (ref 3.5–5.1)
Sodium: 136 mmol/L (ref 135–145)

## 2019-02-12 LAB — PHOSPHORUS: Phosphorus: 3.7 mg/dL (ref 2.5–4.6)

## 2019-02-12 LAB — MAGNESIUM: Magnesium: 1.8 mg/dL (ref 1.7–2.4)
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
## 2019-02-12 NOTE — Plan of Care (Signed)
  Problem: Education: Goal: Knowledge of General Education information will improve Description: Including pain rating scale, medication(s)/side effects and non-pharmacologic comfort measures Outcome: Progressing   Problem: Health Behavior/Discharge Planning: Goal: Ability to manage health-related needs will improve Outcome: Progressing   Problem: Clinical Measurements: Goal: Ability to maintain clinical measurements within normal limits will improve Outcome: Progressing Goal: Will remain free from infection Outcome: Progressing Goal: Diagnostic test results will improve Outcome: Progressing Goal: Respiratory complications will improve Outcome: Progressing Goal: Cardiovascular complication will be avoided Outcome: Progressing   Problem: Activity: Goal: Risk for activity intolerance will decrease Outcome: Progressing   Problem: Coping: Goal: Level of anxiety will decrease Outcome: Progressing   Problem: Elimination: Goal: Will not experience complications related to bowel motility Outcome: Progressing Goal: Will not experience complications related to urinary retention Outcome: Progressing   Problem: Pain Managment: Goal: General experience of comfort will improve Outcome: Progressing   Problem: Safety: Goal: Ability to remain free from injury will improve Outcome: Progressing   Problem: Skin Integrity: Goal: Risk for impaired skin integrity will decrease Outcome: Progressing   Problem: Education: Goal: Required Educational Video(s) Outcome: Progressing   Problem: Clinical Measurements: Goal: Ability to maintain clinical measurements within normal limits will improve Outcome: Progressing Goal: Postoperative complications will be avoided or minimized Outcome: Progressing   Problem: Skin Integrity: Goal: Demonstration of wound healing without infection will improve Outcome: Progressing
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
## 2019-02-13 ENCOUNTER — Inpatient Hospital Stay (HOSPITAL_COMMUNITY): Payer: PRIVATE HEALTH INSURANCE

## 2019-02-13 LAB — CBC
HCT: 27.8 % — ABNORMAL LOW (ref 36.0–46.0)
Hemoglobin: 8.5 g/dL — ABNORMAL LOW (ref 12.0–15.0)
MCH: 26.4 pg (ref 26.0–34.0)
MCHC: 30.6 g/dL (ref 30.0–36.0)
MCV: 86.3 fL (ref 80.0–100.0)
Platelets: 264 10*3/uL (ref 150–400)
RBC: 3.22 MIL/uL — ABNORMAL LOW (ref 3.87–5.11)
RDW: 13.4 % (ref 11.5–15.5)
WBC: 11.8 10*3/uL — ABNORMAL HIGH (ref 4.0–10.5)
nRBC: 0 % (ref 0.0–0.2)

## 2019-02-13 LAB — BASIC METABOLIC PANEL
Anion gap: 9 (ref 5–15)
BUN: 13 mg/dL (ref 6–20)
CO2: 25 mmol/L (ref 22–32)
Calcium: 7.9 mg/dL — ABNORMAL LOW (ref 8.9–10.3)
Chloride: 104 mmol/L (ref 98–111)
Creatinine, Ser: 0.62 mg/dL (ref 0.44–1.00)
GFR calc Af Amer: >60 mL/min (ref >60)
GFR calc non Af Amer: >60 mL/min (ref >60)
Glucose, Bld: 100 mg/dL — ABNORMAL HIGH (ref 70–99)
Potassium: 4.1 mmol/L (ref 3.5–5.1)
Sodium: 138 mmol/L (ref 135–145)

## 2019-02-13 LAB — HOMOCYSTEINE: Homocysteine: 8.1 umol/L (ref 0.0–14.5)

## 2019-02-13 LAB — PROTEIN C ACTIVITY: Protein C Activity: 72 % — ABNORMAL LOW (ref 73–180)

## 2019-02-13 LAB — PROTEIN S ACTIVITY: Protein S Activity: 50 % — ABNORMAL LOW (ref 63–140)

## 2019-02-13 LAB — PROTEIN C, TOTAL: Protein C, Total: 48 % — ABNORMAL LOW (ref 60–150)

## 2019-02-13 LAB — PROTEIN S, TOTAL: Protein S Ag, Total: 76 % (ref 60–150)

## 2019-02-13 MED ORDER — DIPHENHYDRAMINE HCL 25 MG PO CAPS: 25.0000 mg | ORAL_CAPSULE | Freq: Four times a day (QID) | ORAL | Status: DC | PRN

## 2019-02-13 MED ORDER — GUAIFENESIN ER 600 MG PO TB12
600.0000 mg | ORAL_TABLET | Freq: Two times a day (BID) | ORAL | Status: DC
  Administered 2019-02-13 – 2019-02-19 (×12): 600 mg via ORAL
  Filled 2019-02-13 (×12): qty 1

## 2019-02-13 MED ORDER — CEFAZOLIN SODIUM-DEXTROSE 2-4 GM/100ML-% IV SOLN
2.0000 g | INTRAVENOUS | Status: AC
  Administered 2019-02-14: 2 g via INTRAVENOUS
  Filled 2019-02-13: qty 100

## 2019-02-13 MED ORDER — IPRATROPIUM-ALBUTEROL 0.5-2.5 (3) MG/3ML IN SOLN: 3.0000 mL | Freq: Four times a day (QID) | RESPIRATORY_TRACT | Status: DC | PRN

## 2019-02-13 MED ORDER — POLYETHYLENE GLYCOL 3350 17 G PO PACK: 17.0000 g | PACK | Freq: Every day | ORAL | Status: DC | PRN

## 2019-02-13 MED ORDER — LIDOCAINE 5 % EX PTCH
2.0000 | MEDICATED_PATCH | CUTANEOUS | Status: DC
  Administered 2019-02-13 – 2019-02-18 (×6): 2 via TRANSDERMAL
  Filled 2019-02-13 (×7): qty 2

## 2019-02-13 MED ORDER — DIPHENHYDRAMINE HCL 12.5 MG/5ML PO ELIX: 6.2500 mg | ORAL_SOLUTION | Freq: Four times a day (QID) | ORAL | Status: DC | PRN

## 2019-02-13 MED ORDER — SALINE SPRAY 0.65 % NA SOLN
1.0000 | NASAL | Status: DC | PRN
  Filled 2019-02-13: qty 44

## 2019-02-13 NOTE — Progress Notes (Signed)
Rehab Admissions Coordinator Note:  Per OT recommendation, this patient was screened by Raechel Ache for appropriateness for an Inpatient Acute Rehab Consult.  At this time, we are recommending Inpatient Rehab consult. Noted pt has preference for rehab at [facility]. Will request CIR consult order from MD and can be back up if pt would like to be considered here.   Raechel Ache 02/13/2019, 3:28 PM  I can be reached at [phone].
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
## 2019-02-13 NOTE — Progress Notes (Signed)
Orthopedic Tech Progress Note Patient Details:  Patricia Hill 1975-06-16 [id number]  Ortho Devices Type of Ortho Device: Arm sling Ortho Device/Splint Location: LUE Ortho Device/Splint Interventions: Ordered, Application   Post Interventions Patient Tolerated: Well   [name] 02/13/2019, 1:32 PM

## 2019-02-14 ENCOUNTER — Inpatient Hospital Stay (HOSPITAL_COMMUNITY): Payer: PRIVATE HEALTH INSURANCE | Admitting: Anesthesiology

## 2019-02-14 ENCOUNTER — Encounter (HOSPITAL_COMMUNITY): Admission: EM | Disposition: A | Discharge status: Rehabilitation Facility | Payer: Self-pay | Source: Home / Self Care

## 2019-02-14 ENCOUNTER — Inpatient Hospital Stay (HOSPITAL_COMMUNITY): Payer: PRIVATE HEALTH INSURANCE

## 2019-02-14 ENCOUNTER — Encounter (HOSPITAL_COMMUNITY): Payer: Self-pay | Admitting: Anesthesiology

## 2019-02-14 HISTORY — PX: ORIF HUMERUS FRACTURE: SHX2126

## 2019-02-14 LAB — FACTOR 5 LEIDEN

## 2019-02-14 SURGERY — OPEN REDUCTION INTERNAL FIXATION (ORIF) PROXIMAL HUMERUS FRACTURE
Anesthesia: Regional | Laterality: Left

## 2019-02-14 MED ORDER — SODIUM CHLORIDE (PF) 0.9 % IJ SOLN
INTRAMUSCULAR | Status: AC
  Filled 2019-02-14: qty 10

## 2019-02-14 MED ORDER — ALBUMIN HUMAN 5 % IV SOLN
INTRAVENOUS | Status: DC | PRN
  Administered 2019-02-14: 09:00:00 via INTRAVENOUS

## 2019-02-14 MED ORDER — MIDAZOLAM HCL 5 MG/5ML IJ SOLN
INTRAMUSCULAR | Status: DC | PRN
  Administered 2019-02-14 (×2): 1 mg via INTRAVENOUS

## 2019-02-14 MED ORDER — FENTANYL CITRATE (PF) 100 MCG/2ML IJ SOLN
INTRAMUSCULAR | Status: DC | PRN
  Administered 2019-02-14 (×2): 25 ug via INTRAVENOUS

## 2019-02-14 MED ORDER — HYDROMORPHONE HCL 1 MG/ML IJ SOLN: 0.2500 mg | INTRAMUSCULAR | Status: DC | PRN

## 2019-02-14 MED ORDER — SCOPOLAMINE 1 MG/3DAYS TD PT72
MEDICATED_PATCH | TRANSDERMAL | Status: DC | PRN
  Administered 2019-02-14: 1 via TRANSDERMAL

## 2019-02-14 MED ORDER — ONDANSETRON HCL 4 MG/2ML IJ SOLN
INTRAMUSCULAR | Status: AC
  Filled 2019-02-14: qty 2

## 2019-02-14 MED ORDER — SUCCINYLCHOLINE CHLORIDE 20 MG/ML IJ SOLN
INTRAMUSCULAR | Status: DC | PRN
  Administered 2019-02-14: 140 mg via INTRAVENOUS

## 2019-02-14 MED ORDER — SUCCINYLCHOLINE CHLORIDE 200 MG/10ML IV SOSY
PREFILLED_SYRINGE | INTRAVENOUS | Status: AC
  Filled 2019-02-14: qty 10

## 2019-02-14 MED ORDER — CLONIDINE HCL (ANALGESIA) 100 MCG/ML EP SOLN
EPIDURAL | Status: DC | PRN
  Administered 2019-02-14: 100 ug

## 2019-02-14 MED ORDER — 0.9 % SODIUM CHLORIDE (POUR BTL) OPTIME
TOPICAL | Status: DC | PRN
  Administered 2019-02-14: 1000 mL

## 2019-02-14 MED ORDER — PROMETHAZINE HCL 25 MG/ML IJ SOLN: 6.2500 mg | INTRAMUSCULAR | Status: DC | PRN

## 2019-02-14 MED ORDER — MIDAZOLAM HCL 2 MG/2ML IJ SOLN
INTRAMUSCULAR | Status: AC
  Filled 2019-02-14: qty 2

## 2019-02-14 MED ORDER — ROPIVACAINE HCL 5 MG/ML IJ SOLN
INTRAMUSCULAR | Status: DC | PRN
  Administered 2019-02-14: 30 mL via PERINEURAL

## 2019-02-14 MED ORDER — PROPOFOL 10 MG/ML IV BOLUS
INTRAVENOUS | Status: DC | PRN
  Administered 2019-02-14: 120 mg via INTRAVENOUS

## 2019-02-14 MED ORDER — DOCUSATE SODIUM 100 MG PO CAPS: 100.0000 mg | ORAL_CAPSULE | Freq: Two times a day (BID) | ORAL | Status: DC

## 2019-02-14 MED ORDER — DEXAMETHASONE SODIUM PHOSPHATE 10 MG/ML IJ SOLN
INTRAMUSCULAR | Status: DC | PRN
  Administered 2019-02-14: 10 mg via INTRAVENOUS

## 2019-02-14 MED ORDER — ONDANSETRON HCL 4 MG PO TABS: 4.0000 mg | ORAL_TABLET | Freq: Four times a day (QID) | ORAL | Status: DC | PRN

## 2019-02-14 MED ORDER — LIDOCAINE 2% (20 MG/ML) 5 ML SYRINGE
INTRAMUSCULAR | Status: DC | PRN
  Administered 2019-02-14: 100 mg via INTRAVENOUS

## 2019-02-14 MED ORDER — PHENOL 1.4 % MT LIQD: 1.0000 | OROMUCOSAL | Status: DC | PRN

## 2019-02-14 MED ORDER — MENTHOL 3 MG MT LOZG: 1.0000 | LOZENGE | OROMUCOSAL | Status: DC | PRN

## 2019-02-14 MED ORDER — SCOPOLAMINE 1 MG/3DAYS TD PT72
MEDICATED_PATCH | TRANSDERMAL | Status: AC
  Filled 2019-02-14: qty 1

## 2019-02-14 MED ORDER — ROCURONIUM BROMIDE 50 MG/5ML IV SOSY
PREFILLED_SYRINGE | INTRAVENOUS | Status: DC | PRN
  Administered 2019-02-14: 50 mg via INTRAVENOUS
  Administered 2019-02-14: 20 mg via INTRAVENOUS

## 2019-02-14 MED ORDER — LIDOCAINE 2% (20 MG/ML) 5 ML SYRINGE
INTRAMUSCULAR | Status: AC
  Filled 2019-02-14: qty 5

## 2019-02-14 MED ORDER — SUGAMMADEX SODIUM 500 MG/5ML IV SOLN
INTRAVENOUS | Status: DC | PRN
  Administered 2019-02-14: 300 mg via INTRAVENOUS

## 2019-02-14 MED ORDER — ROCURONIUM BROMIDE 10 MG/ML (PF) SYRINGE
PREFILLED_SYRINGE | INTRAVENOUS | Status: AC
  Filled 2019-02-14: qty 10

## 2019-02-14 MED ORDER — ONDANSETRON HCL 4 MG/2ML IJ SOLN: 4.0000 mg | Freq: Four times a day (QID) | INTRAMUSCULAR | Status: DC | PRN

## 2019-02-14 MED ORDER — DEXAMETHASONE SODIUM PHOSPHATE 10 MG/ML IJ SOLN
INTRAMUSCULAR | Status: AC
  Filled 2019-02-14: qty 1

## 2019-02-14 MED ORDER — ONDANSETRON HCL 4 MG/2ML IJ SOLN
INTRAMUSCULAR | Status: DC | PRN
  Administered 2019-02-14: 4 mg via INTRAVENOUS

## 2019-02-14 MED ORDER — PROPOFOL 10 MG/ML IV BOLUS
INTRAVENOUS | Status: AC
  Filled 2019-02-14: qty 40

## 2019-02-14 MED ORDER — CEFAZOLIN SODIUM-DEXTROSE 2-4 GM/100ML-% IV SOLN
2.0000 g | Freq: Four times a day (QID) | INTRAVENOUS | Status: AC
  Administered 2019-02-14 – 2019-02-15 (×3): 2 g via INTRAVENOUS
  Filled 2019-02-14 (×4): qty 100

## 2019-02-14 MED ORDER — FENTANYL CITRATE (PF) 250 MCG/5ML IJ SOLN
INTRAMUSCULAR | Status: AC
  Filled 2019-02-14: qty 5

## 2019-02-14 MED ORDER — SUGAMMADEX SODIUM 500 MG/5ML IV SOLN
INTRAVENOUS | Status: AC
  Filled 2019-02-14: qty 5

## 2019-02-14 SURGICAL SUPPLY — 78 items
BENZOIN TINCTURE PRP APPL 2/3 (GAUZE/BANDAGES/DRESSINGS) ×6 IMPLANT
BIT DRILL 3.2 (BIT) ×2
BIT DRILL 3.2XCALB NS DISP (BIT) IMPLANT
BIT DRILL CALIBRATED 2.7 (BIT) ×1 IMPLANT
BIT DRILL CALIBRATED 2.7MM (BIT) ×1
BIT DRL 3.2XCALB NS DISP (BIT) ×1
BNDG GAUZE ELAST 4 BULKY (GAUZE/BANDAGES/DRESSINGS) ×6 IMPLANT
BONE CANC CHIPS 20CC PCAN1/4 (Bone Implant) ×3 IMPLANT
BRUSH SCRUB EZ PLAIN DRY (MISCELLANEOUS) ×6 IMPLANT
CHIPS CANC BONE 20CC PCAN1/4 (Bone Implant) ×1 IMPLANT
COVER SURGICAL LIGHT HANDLE (MISCELLANEOUS) ×6 IMPLANT
COVER WAND RF STERILE (DRAPES) ×3 IMPLANT
DRAPE C-ARM 42X72 X-RAY (DRAPES) ×3 IMPLANT
DRAPE C-ARMOR (DRAPES) ×3 IMPLANT
DRAPE IMP U-DRAPE 54X76 (DRAPES) ×3 IMPLANT
DRAPE INCISE IOBAN 66X45 STRL (DRAPES) IMPLANT
DRAPE ORTHO SPLIT 77X108 STRL (DRAPES) ×4
DRAPE SURG 17X11 SM STRL (DRAPES) ×6 IMPLANT
DRAPE SURG ORHT 6 SPLT 77X108 (DRAPES) ×2 IMPLANT
DRAPE U-SHAPE 47X51 STRL (DRAPES) ×6 IMPLANT
DRSG ADAPTIC 3X8 NADH LF (GAUZE/BANDAGES/DRESSINGS) ×3 IMPLANT
DRSG MEPILEX BORDER 4X8 (GAUZE/BANDAGES/DRESSINGS) ×2 IMPLANT
DRSG PAD ABDOMINAL 8X10 ST (GAUZE/BANDAGES/DRESSINGS) ×3 IMPLANT
ELECT REM PT RETURN 9FT ADLT (ELECTROSURGICAL) ×3
ELECTRODE REM PT RTRN 9FT ADLT (ELECTROSURGICAL) ×1 IMPLANT
EVACUATOR 1/8 PVC DRAIN (DRAIN) IMPLANT
GAUZE SPONGE 4X4 12PLY STRL (GAUZE/BANDAGES/DRESSINGS) ×6 IMPLANT
GLOVE BIO SURGEON STRL SZ7.5 (GLOVE) ×3 IMPLANT
GLOVE BIO SURGEON STRL SZ8 (GLOVE) ×3 IMPLANT
GLOVE BIOGEL PI IND STRL 7.5 (GLOVE) ×1 IMPLANT
GLOVE BIOGEL PI IND STRL 8 (GLOVE) ×1 IMPLANT
GLOVE BIOGEL PI INDICATOR 7.5 (GLOVE) ×2
GLOVE BIOGEL PI INDICATOR 8 (GLOVE) ×2
GOWN STRL REUS W/ TWL LRG LVL3 (GOWN DISPOSABLE) ×2 IMPLANT
GOWN STRL REUS W/ TWL XL LVL3 (GOWN DISPOSABLE) ×1 IMPLANT
GOWN STRL REUS W/TWL 2XL LVL3 (GOWN DISPOSABLE) ×3 IMPLANT
GOWN STRL REUS W/TWL LRG LVL3 (GOWN DISPOSABLE) ×4
GOWN STRL REUS W/TWL XL LVL3 (GOWN DISPOSABLE) ×2
GRAFT BNE CANC CHIPS 1-8 20CC (Bone Implant) IMPLANT
K-WIRE 2X5 SS THRDED S3 (WIRE) ×9
KIT BASIN OR (CUSTOM PROCEDURE TRAY) ×3 IMPLANT
KIT TURNOVER KIT B (KITS) ×3 IMPLANT
KWIRE 2X5 SS THRDED S3 (WIRE) IMPLANT
LOOP VESSEL MAXI BLUE (MISCELLANEOUS) IMPLANT
MANIFOLD NEPTUNE II (INSTRUMENTS) ×3 IMPLANT
NS IRRIG 1000ML POUR BTL (IV SOLUTION) ×3 IMPLANT
PACK TOTAL JOINT (CUSTOM PROCEDURE TRAY) ×3 IMPLANT
PACK UNIVERSAL I (CUSTOM PROCEDURE TRAY) ×3 IMPLANT
PAD ARMBOARD 7.5X6 YLW CONV (MISCELLANEOUS) ×6 IMPLANT
PEG LOCKING 3.2X32 (Peg) ×2 IMPLANT
PEG LOCKING 3.2X34 (Screw) ×2 IMPLANT
PEG LOCKING 3.2X36 (Screw) ×4 IMPLANT
PEG LOCKING 3.2X38 (Screw) ×4 IMPLANT
PEG LOCKING 3.2X42 (Screw) ×2 IMPLANT
PEG LOCKING 3.2X48 (Peg) ×2 IMPLANT
PLATE HUMERUS LP PROX L 3H (Plate) ×2 IMPLANT
SCREW LP NL T15 3.5X24 (Screw) ×2 IMPLANT
SCREW LP NL T15 3.5X26 (Screw) ×2 IMPLANT
SPONGE LAP 18X18 RF (DISPOSABLE) IMPLANT
STAPLER VISISTAT 35W (STAPLE) ×3 IMPLANT
STOCKINETTE IMPERVIOUS LG (DRAPES) ×3 IMPLANT
SUCTION FRAZIER HANDLE 10FR (MISCELLANEOUS) ×2
SUCTION TUBE FRAZIER 10FR DISP (MISCELLANEOUS) ×1 IMPLANT
SUT ETHIBOND 5 LR DA (SUTURE) ×3 IMPLANT
SUT FIBERWIRE #2 38 T-5 BLUE (SUTURE)
SUT PDS AB 2-0 CT1 27 (SUTURE) IMPLANT
SUT VIC AB 0 CT1 27 (SUTURE) ×4
SUT VIC AB 0 CT1 27XBRD ANBCTR (SUTURE) ×2 IMPLANT
SUT VIC AB 2-0 CT1 27 (SUTURE) ×4
SUT VIC AB 2-0 CT1 TAPERPNT 27 (SUTURE) ×2 IMPLANT
SUT VIC AB 2-0 CT3 27 (SUTURE) IMPLANT
SUTURE FIBERWR #2 38 T-5 BLUE (SUTURE) IMPLANT
SYR 5ML LL (SYRINGE) IMPLANT
TOWEL GREEN STERILE (TOWEL DISPOSABLE) ×6 IMPLANT
TOWEL GREEN STERILE FF (TOWEL DISPOSABLE) ×3 IMPLANT
TRAY FOLEY MTR SLVR 16FR STAT (SET/KITS/TRAYS/PACK) IMPLANT
WATER STERILE IRR 1000ML POUR (IV SOLUTION) ×3 IMPLANT
YANKAUER SUCT BULB TIP NO VENT (SUCTIONS) IMPLANT

## 2019-02-14 NOTE — Progress Notes (Signed)
o2 by sm at 6lpm/ sats >/= 94 , tachypneic, no pain / spoke with dr rose / mda--> hold on cxr for now

## 2019-02-14 NOTE — Progress Notes (Signed)
Inpatient Rehab Admissions:  Inpatient Rehab Consult received. Spoke with Ellan Lambert, CM, who states pt would like to receive AIR services at her hospital in [place].  Will sign off at this time.    Shann Medal, PT, DPT Admissions Coordinator [phone] 02/14/19  3:08 PM

## 2019-02-14 NOTE — Anesthesia Postprocedure Evaluation (Signed)
Anesthesia Post Note  Patient: Patricia Hill  Procedure(s) Performed: OPEN REDUCTION INTERNAL FIXATION (ORIF) PROXIMAL HUMERUS FRACTURE (Left )     Patient location during evaluation: PACU Anesthesia Type: General Level of consciousness: awake and alert Pain management: pain level controlled Vital Signs Assessment: post-procedure vital signs reviewed and stable Respiratory status: spontaneous breathing, nonlabored ventilation, respiratory function stable and patient connected to nasal cannula oxygen Cardiovascular status: blood pressure returned to baseline and stable Postop Assessment: no apparent nausea or vomiting Anesthetic complications: no    Last Vitals:  Vitals:   02/14/19 1216 02/14/19 1519  BP: (!) 135/55 (!) 131/56  Pulse: 93 95  Resp: (!) 24 (!) 22  Temp: 36.8 C 36.9 C  SpO2: 90% 92%    Last Pain:  Vitals:   02/14/19 1522  TempSrc:   PainSc: 0-No pain                 Zakary Kimura S

## 2019-02-14 NOTE — Transfer of Care (Signed)
Immediate Anesthesia Transfer of Care Note  Patient: Patricia Hill  Procedure(s) Performed: OPEN REDUCTION INTERNAL FIXATION (ORIF) PROXIMAL HUMERUS FRACTURE (Left )  Patient Location: PACU  Anesthesia Type:General and Regional  Level of Consciousness: awake, alert , oriented and sedated  Airway & Oxygen Therapy: Patient Spontanous Breathing and Patient connected to face mask oxygen  Post-op Assessment: Report given to RN, Post -op Vital signs reviewed and stable and Patient moving all extremities  Post vital signs: Reviewed and stable  Last Vitals:  Vitals Value Taken Time  BP 152/53 02/14/19 1057  Temp    Pulse 91 02/14/19 1102  Resp 27 02/14/19 1102  SpO2 93 % 02/14/19 1102  Vitals shown include unvalidated device data.  Last Pain:  Vitals:   02/14/19 0531  TempSrc:   PainSc: Asleep         Complications: No apparent anesthesia complications

## 2019-02-14 NOTE — Anesthesia Procedure Notes (Signed)
Anesthesia Regional Block: Supraclavicular block   Pre-Anesthetic Checklist: ,, timeout performed, Correct Patient, Correct Site, Correct Laterality, Correct Procedure, Correct Position, site marked, Risks and benefits discussed,  Surgical consent,  Pre-op evaluation,  At surgeon's request and post-op pain management  Laterality: Left  Prep: chloraprep       Needles:  Injection technique: Single-shot  Needle Type: Echogenic Needle     Needle Length: 9cm      Additional Needles:   Procedures:,,,, ultrasound used (permanent image in chart),,,,  Narrative:  Start time: 02/14/2019 7:24 AM End time: 02/14/2019 7:35 AM Injection made incrementally with aspirations every 5 mL.  Performed by: Personally  Anesthesiologist: Myrtie Soman, MD  Additional Notes: Patient tolerated the procedure well without complications

## 2019-02-14 NOTE — TOC Progression Note (Addendum)
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.  Plan CIR pending medical stability and insurance auth.  Dala Dock, admissions   Phone:  [phone] Fax:  [phone]   [name]; [name], [name]      Expected Discharge Plan: IP Rehab Facility Barriers to Discharge: Continued Medical Work up  Expected Discharge Plan and Services Expected Discharge Plan: [name]   Discharge Planning Services: CM Consult   Living arrangements for the past 2 months: [place] W. Patricia Arshad, RN, BSN  Trauma/Neuro ICU Case Manager [phone]                     .j

## 2019-02-14 NOTE — Anesthesia Procedure Notes (Signed)
Anesthesia Procedure Image    

## 2019-02-14 NOTE — Anesthesia Preprocedure Evaluation (Signed)
Anesthesia Evaluation  Patient identified by MRN, date of birth, ID band Patient awake    Reviewed: Allergy & Precautions, NPO status , Patient's Chart, lab work & pertinent test results  Airway Mallampati: II  TM Distance: >3 FB Neck ROM: Full    Dental no notable dental hx.    Pulmonary neg pulmonary ROS,    Pulmonary exam normal breath sounds clear to auscultation + decreased breath sounds      Cardiovascular negative cardio ROS Normal cardiovascular exam Rhythm:Regular Rate:Normal     Neuro/Psych negative neurological ROS  negative psych ROS   GI/Hepatic negative GI ROS, Neg liver ROS,   Endo/Other  Morbid obesity  Renal/GU negative Renal ROS  negative genitourinary   Musculoskeletal negative musculoskeletal ROS (+)   Abdominal (+) + obese,   Peds negative pediatric ROS (+)  Hematology  (+) anemia ,   Anesthesia Other Findings   Reproductive/Obstetrics negative OB ROS                             Anesthesia Physical Anesthesia Plan  ASA: III  Anesthesia Plan: General   Post-op Pain Management:  Regional for Post-op pain   Induction: Intravenous  PONV Risk Score and Plan: 3 and Ondansetron, Dexamethasone and Treatment may vary due to age or medical condition  Airway Management Planned: Oral ETT  Additional Equipment:   Intra-op Plan:   Post-operative Plan: Extubation in OR  Informed Consent: I have reviewed the patients History and Physical, chart, labs and discussed the procedure including the risks, benefits and alternatives for the proposed anesthesia with the patient or authorized representative who has indicated his/her understanding and acceptance.     Dental advisory given  Plan Discussed with: CRNA and Surgeon  Anesthesia Plan Comments:         Anesthesia Quick Evaluation

## 2019-02-14 NOTE — Anesthesia Procedure Notes (Signed)
Procedure Name: Intubation Date/Time: 02/14/2019 8:29 AM Performed by: Scheryl Darter, CRNA Pre-anesthesia Checklist: Patient identified, Emergency Drugs available, Suction available and Patient being monitored Patient Re-evaluated:Patient Re-evaluated prior to induction Oxygen Delivery Method: Circle System Utilized Preoxygenation: Pre-oxygenation with 100% oxygen Induction Type: IV induction Ventilation: Mask ventilation without difficulty Laryngoscope Size: Mac and 3 Grade View: Grade II Tube type: Oral Number of attempts: 1 Airway Equipment and Method: Stylet and Oral airway Placement Confirmation: ETT inserted through vocal cords under direct vision,  positive ETCO2 and breath sounds checked- equal and bilateral Tube secured with: Tape Dental Injury: Teeth and Oropharynx as per pre-operative assessment
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
## 2019-02-15 LAB — CBC
HCT: 25.8 % — ABNORMAL LOW (ref 36.0–46.0)
Hemoglobin: 8.1 g/dL — ABNORMAL LOW (ref 12.0–15.0)
MCH: 26.8 pg (ref 26.0–34.0)
MCHC: 31.4 g/dL (ref 30.0–36.0)
MCV: 85.4 fL (ref 80.0–100.0)
Platelets: 279 10*3/uL (ref 150–400)
RBC: 3.02 MIL/uL — ABNORMAL LOW (ref 3.87–5.11)
RDW: 13.7 % (ref 11.5–15.5)
WBC: 12.2 10*3/uL — ABNORMAL HIGH (ref 4.0–10.5)
nRBC: 0.2 % (ref 0.0–0.2)

## 2019-02-15 LAB — BASIC METABOLIC PANEL
Anion gap: 11 (ref 5–15)
BUN: 13 mg/dL (ref 6–20)
CO2: 26 mmol/L (ref 22–32)
Calcium: 8.2 mg/dL — ABNORMAL LOW (ref 8.9–10.3)
Chloride: 104 mmol/L (ref 98–111)
Creatinine, Ser: 0.5 mg/dL (ref 0.44–1.00)
GFR calc Af Amer: >60 mL/min (ref >60)
GFR calc non Af Amer: >60 mL/min (ref >60)
Glucose, Bld: 104 mg/dL — ABNORMAL HIGH (ref 70–99)
Potassium: 4.3 mmol/L (ref 3.5–5.1)
Sodium: 141 mmol/L (ref 135–145)

## 2019-02-15 LAB — MAGNESIUM: Magnesium: 2.2 mg/dL (ref 1.7–2.4)

## 2019-02-15 LAB — PHOSPHORUS: Phosphorus: 3.2 mg/dL (ref 2.5–4.6)
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
## 2019-02-15 NOTE — TOC Progression Note (Signed)
Transition of Care [facility]) - Progression Note    Patient Details  Name: Patricia Hill MRN: [id number] Date of Birth: 1975-07-31  [facility] CM/SW Contact  Ella Bodo, RN Phone Number: 02/15/2019, 5:20 PM  Clinical Narrative:  Updated patient on status of referral to [facility] in [place].  Bed available for patient on Tuesday, 11/3 pending medical stability and insurance authorization.  Faxed updated PT/OT notes to rehab facility, per request.  Plan COVID test on 11/1 in preparation for transfer to facility.  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 She will arrange these services; RN Case Manager to follow up with pt on Monday to confirm arrangements.  Plan to fax updated PT notes to rehab admissions on Monday.       Expected Discharge Plan: IP Rehab Facility Barriers to Discharge: Continued Medical Work up  Expected Discharge Plan and Services Expected Discharge Plan: [place]   Discharge Planning Services: CM Consult   Living arrangements for the past 2 months: [place], RN, BSN  Trauma/Neuro ICU Case Manager [phone]
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
## 2019-02-16 ENCOUNTER — Encounter (HOSPITAL_COMMUNITY): Payer: Self-pay | Admitting: Orthopedic Surgery

## 2019-02-16 DIAGNOSIS — Z79899 Other long term (current) drug therapy: Secondary | ICD-10-CM

## 2019-02-16 DIAGNOSIS — N920 Excessive and frequent menstruation with regular cycle: Secondary | ICD-10-CM

## 2019-02-16 DIAGNOSIS — Z7901 Long term (current) use of anticoagulants: Secondary | ICD-10-CM

## 2019-02-16 DIAGNOSIS — D6851 Activated protein C resistance: Secondary | ICD-10-CM

## 2019-02-16 LAB — BASIC METABOLIC PANEL
Anion gap: 14 (ref 5–15)
BUN: 19 mg/dL (ref 6–20)
CO2: 25 mmol/L (ref 22–32)
Calcium: 8.3 mg/dL — ABNORMAL LOW (ref 8.9–10.3)
Chloride: 103 mmol/L (ref 98–111)
Creatinine, Ser: 0.57 mg/dL (ref 0.44–1.00)
GFR calc Af Amer: >60 mL/min (ref >60)
GFR calc non Af Amer: >60 mL/min (ref >60)
Glucose, Bld: 94 mg/dL (ref 70–99)
Potassium: 3.7 mmol/L (ref 3.5–5.1)
Sodium: 142 mmol/L (ref 135–145)

## 2019-02-16 LAB — CBC
HCT: 27 % — ABNORMAL LOW (ref 36.0–46.0)
Hemoglobin: 8.2 g/dL — ABNORMAL LOW (ref 12.0–15.0)
MCH: 26.1 pg (ref 26.0–34.0)
MCHC: 30.4 g/dL (ref 30.0–36.0)
MCV: 86 fL (ref 80.0–100.0)
Platelets: 276 10*3/uL (ref 150–400)
RBC: 3.14 MIL/uL — ABNORMAL LOW (ref 3.87–5.11)
RDW: 14 % (ref 11.5–15.5)
WBC: 12 10*3/uL — ABNORMAL HIGH (ref 4.0–10.5)
nRBC: 0.3 % — ABNORMAL HIGH (ref 0.0–0.2)

## 2019-02-16 LAB — MAGNESIUM: Magnesium: 1.9 mg/dL (ref 1.7–2.4)

## 2019-02-16 LAB — PHOSPHORUS: Phosphorus: 4.7 mg/dL — ABNORMAL HIGH (ref 2.5–4.6)
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
## 2019-02-17 LAB — BASIC METABOLIC PANEL
Anion gap: 8 (ref 5–15)
BUN: 13 mg/dL (ref 6–20)
CO2: 26 mmol/L (ref 22–32)
Calcium: 8.2 mg/dL — ABNORMAL LOW (ref 8.9–10.3)
Chloride: 106 mmol/L (ref 98–111)
Creatinine, Ser: 0.46 mg/dL (ref 0.44–1.00)
GFR calc Af Amer: >60 mL/min (ref >60)
GFR calc non Af Amer: >60 mL/min (ref >60)
Glucose, Bld: 98 mg/dL (ref 70–99)
Potassium: 3.6 mmol/L (ref 3.5–5.1)
Sodium: 140 mmol/L (ref 135–145)

## 2019-02-17 LAB — CBC
HCT: 26.2 % — ABNORMAL LOW (ref 36.0–46.0)
Hemoglobin: 8.1 g/dL — ABNORMAL LOW (ref 12.0–15.0)
MCH: 26.2 pg (ref 26.0–34.0)
MCHC: 30.9 g/dL (ref 30.0–36.0)
MCV: 84.8 fL (ref 80.0–100.0)
Platelets: 287 10*3/uL (ref 150–400)
RBC: 3.09 MIL/uL — ABNORMAL LOW (ref 3.87–5.11)
RDW: 14 % (ref 11.5–15.5)
WBC: 11.5 10*3/uL — ABNORMAL HIGH (ref 4.0–10.5)
nRBC: 0.3 % — ABNORMAL HIGH (ref 0.0–0.2)

## 2019-02-17 LAB — PHOSPHORUS: Phosphorus: 3.7 mg/dL (ref 2.5–4.6)

## 2019-02-17 LAB — MAGNESIUM: Magnesium: 2 mg/dL (ref 1.7–2.4)
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
## 2019-02-18 LAB — SARS CORONAVIRUS 2 (TAT 6-24 HRS): SARS Coronavirus 2: NEGATIVE

## 2019-02-18 LAB — PROTHROMBIN GENE MUTATION

## 2019-02-18 MED ORDER — ENOXAPARIN SODIUM 40 MG/0.4ML ~~LOC~~ SOLN
40.0000 mg | Freq: Two times a day (BID) | SUBCUTANEOUS | Status: DC
  Administered 2019-02-18 – 2019-02-19 (×2): 40 mg via SUBCUTANEOUS
  Filled 2019-02-18 (×2): qty 0.4
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
## 2019-02-19 LAB — BASIC METABOLIC PANEL
Anion gap: 9 (ref 5–15)
BUN: 12 mg/dL (ref 6–20)
CO2: 23 mmol/L (ref 22–32)
Calcium: 8.4 mg/dL — ABNORMAL LOW (ref 8.9–10.3)
Chloride: 108 mmol/L (ref 98–111)
Creatinine, Ser: 0.49 mg/dL (ref 0.44–1.00)
GFR calc Af Amer: >60 mL/min (ref >60)
GFR calc non Af Amer: >60 mL/min (ref >60)
Glucose, Bld: 100 mg/dL — ABNORMAL HIGH (ref 70–99)
Potassium: 3.7 mmol/L (ref 3.5–5.1)
Sodium: 140 mmol/L (ref 135–145)

## 2019-02-19 LAB — MAGNESIUM: Magnesium: 2 mg/dL (ref 1.7–2.4)

## 2019-02-19 LAB — PHOSPHORUS: Phosphorus: 3.9 mg/dL (ref 2.5–4.6)

## 2019-02-19 LAB — CBC
HCT: 27.7 % — ABNORMAL LOW (ref 36.0–46.0)
Hemoglobin: 8.3 g/dL — ABNORMAL LOW (ref 12.0–15.0)
MCH: 25.9 pg — ABNORMAL LOW (ref 26.0–34.0)
MCHC: 30 g/dL (ref 30.0–36.0)
MCV: 86.6 fL (ref 80.0–100.0)
Platelets: 308 10*3/uL (ref 150–400)
RBC: 3.2 MIL/uL — ABNORMAL LOW (ref 3.87–5.11)
RDW: 14.5 % (ref 11.5–15.5)
WBC: 11.6 10*3/uL — ABNORMAL HIGH (ref 4.0–10.5)
nRBC: 0.2 % (ref 0.0–0.2)

## 2019-02-19 MED ORDER — GUAIFENESIN ER 600 MG PO TB12: 600.0000 mg | ORAL_TABLET | Freq: Two times a day (BID) | ORAL | Status: AC

## 2019-02-19 MED ORDER — GABAPENTIN 600 MG PO TABS: 300.0000 mg | ORAL_TABLET | Freq: Three times a day (TID) | ORAL | Status: AC

## 2019-02-19 MED ORDER — METHOCARBAMOL 500 MG PO TABS: 1000.0000 mg | ORAL_TABLET | Freq: Three times a day (TID) | ORAL | Status: AC

## 2019-02-19 MED ORDER — VITAMIN D3 25 MCG PO TABS: 2000.0000 [IU] | ORAL_TABLET | Freq: Two times a day (BID) | ORAL | Status: AC

## 2019-02-19 MED ORDER — ENOXAPARIN SODIUM 40 MG/0.4ML ~~LOC~~ SOLN: 40.0000 mg | Freq: Two times a day (BID) | SUBCUTANEOUS | Status: AC

## 2019-02-19 MED ORDER — DOCUSATE SODIUM 100 MG PO CAPS: 100.0000 mg | ORAL_CAPSULE | Freq: Two times a day (BID) | ORAL | 0 refills | Status: AC

## 2019-02-19 MED ORDER — POLYETHYLENE GLYCOL 3350 17 G PO PACK: 17.0000 g | PACK | Freq: Every day | ORAL | 0 refills | Status: AC | PRN

## 2019-02-19 MED ORDER — IPRATROPIUM-ALBUTEROL 0.5-2.5 (3) MG/3ML IN SOLN: 3.0000 mL | Freq: Four times a day (QID) | RESPIRATORY_TRACT | Status: AC | PRN

## 2019-02-19 MED ORDER — ASCORBIC ACID 1000 MG PO TABS: 1000.0000 mg | ORAL_TABLET | Freq: Every day | ORAL | Status: AC

## 2019-02-19 MED ORDER — OXYCODONE HCL 5 MG PO TABS: 5.0000 mg | ORAL_TABLET | ORAL | 0 refills | Status: AC | PRN

## 2019-02-19 NOTE — Progress Notes (Addendum)
Report called to TK at [facility], [phone].

## 2019-02-19 NOTE — TOC Transition Note (Signed)
Transition of Care [facility]) - CM/SW Discharge Note   Patient Details  Name: Patricia Hill MRN: [id number] Date of Birth: June 10, 1975  Transition of Care [facility]) CM/SW Contact:  Ella Bodo, RN Phone Number: 02/19/2019, 12:28 PM   Clinical Narrative: Pt medically stable for discharge and insurance authorization has been received for admission to inpatient rehab at [facility] in [place], [place].  Confirmed arrangements with rehab admissions coordinator, Dala Dock.  Confirmed transportation arrangements with Therapist, sports, Primary school teacher; estimate pickup time for patient between 1:00 and 1:30.   Bedside nurse called report to [phone], as requested.  Faxed dc summary to Leigh with rehab at [phone].        Final next level of care: IP Rehab Facility Barriers to Discharge: Barriers Resolved                       Discharge Plan and Services   Discharge Planning Services: CM Consult                                   Readmission Risk Interventions Readmission Risk Prevention Plan 02/19/2019  Post Dischage Appt Not Complete  Appt Comments Pt discharging to inpatient rehab in [place]  Medication Screening Complete  Transportation Screening Complete   Reinaldo Raddle, RN, BSN  Trauma/Neuro ICU Case Manager [phone]
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
## 2019-02-19 NOTE — Progress Notes (Signed)
Orthopedic Tech Progress Note Patient Details:  Patricia Hill 10/12/1975 [id number] Removed plaster splint and applied a velcro wrist forearm  splint. Ortho Devices Type of Ortho Device: Velcro wrist forearm splint Ortho Device/Splint Location: RUE Ortho Device/Splint Interventions: Removal, Adjustment, Application, Ordered   Post Interventions Patient Tolerated: Well Instructions Provided: Care of device, Adjustment of device   [name] 02/19/2019, 10:20 AM

## 2019-02-19 NOTE — Discharge Instructions (Signed)
Orthopaedic Trauma Service Discharge Instructions   General Discharge Instructions  Orthopaedic Injuries:  Right feoral shaft fracture s/p retrograde IM nailing 02/09/2019             Right Galeazzi fracture s/p ORIF 02/09/2019             Right scapular body fracture and multiple right rib fractures-nonoperative treatment             Left proximal humerus fracture-s/p ORIF 02/14/2019  Suture removal: 11/07 for R forearm and R leg. 11/12 for L shoulder  WEIGHT BEARING STATUS:      Weightbearing as tolerated Right leg      Weightbearing as tolerated left leg      Weightbear as tolerated through Right elbow      Nonweightbearing Left upper extremity    RANGE OF MOTION/ACTIVITY:              Unrestricted range of motion of right elbow and shoulder Unrestricted range of motion right hip, knee and ankle No restrictions to the left leg Pt can feed self with R UEx              Left upper extremity range of motion                         Shoulder pendulums and gentle passive shoulder flexion and extension                         No active shoulder abduction, very gentle passive shoulder abduction                         Unrestricted range of motion of the left elbow, forearm, wrist and hand   Bone health:  You have vitamin d deficiency. Continue to take 5000 IUs vitamin d3 daily and vitamin c 1000mg  daily   Wound Care: daily wound care starting immediately. See below  DISCHARGE WOUND CARE INSTRUCTIONS  Discharge Wound Care Instructions  Do NOT apply any ointments, solutions or lotions to pin sites or surgical wounds.  These prevent needed drainage and even though solutions like hydrogen peroxide kill bacteria, they also damage cells lining the pin sites that help fight infection.  Applying lotions or ointments can keep the wounds moist and can cause them to breakdown and open up as well. This can increase the risk for infection. When in doubt  call the office.  Surgical incisions should be dressed daily.  If any drainage is noted, use one layer of adaptic, then gauze, Kerlix, and an ace wrap. Can use gauze and tape on L shoulder.   Once the incision is completely dry and without drainage, it may be left open to air out.  Showering may begin 36-48 hours later.  Cleaning gently with soap and water.  Traumatic wounds should be dressed daily as well.    One layer of adaptic, gauze, Kerlix, then ace wrap.  The adaptic can be discontinued once the draining has ceased    If you have a wet to dry dressing: wet the gauze with saline the squeeze as much saline out so the gauze is moist (not soaking wet), place moistened gauze over wound, then place a dry gauze over the moist one, followed by Kerlix wrap, then ace wrap.  CALL OFFICE WITH QUESTIONS OR CONCERNS [phone]    Diet: as you were eating previously.  Can use  over the counter stool softeners and bowel preparations, such as Miralax, to help with bowel movements.  Narcotics can be constipating.  Be sure to drink plenty of fluids  PAIN MEDICATION USE AND EXPECTATIONS  You have likely been given narcotic medications to help control your pain.  After a traumatic event that results in an fracture (broken bone) with or without surgery, it is ok to use narcotic pain medications to help control one's pain.  We understand that everyone responds to pain differently and each individual patient will be evaluated on a regular basis for the continued need for narcotic medications. Ideally, narcotic medication use should last no more than 6-8 weeks (coinciding with fracture healing).   As a patient it is your responsibility as well to monitor narcotic medication use and report the amount and frequency you use these medications when you come to your office visit.   We would also advise that if you are using narcotic medications, you should take a dose prior to therapy to maximize you  participation.  IF YOU ARE ON NARCOTIC MEDICATIONS IT IS NOT PERMISSIBLE TO OPERATE A MOTOR VEHICLE (MOTORCYCLE/CAR/TRUCK/MOPED) OR HEAVY MACHINERY DO NOT MIX NARCOTICS WITH OTHER CNS (CENTRAL NERVOUS SYSTEM) DEPRESSANTS SUCH AS ALCOHOL   STOP SMOKING OR USING NICOTINE PRODUCTS!!!!  As discussed nicotine severely impairs your body's ability to heal surgical and traumatic wounds but also impairs bone healing.  Wounds and bone heal by forming microscopic blood vessels (angiogenesis) and nicotine is a vasoconstrictor (essentially, shrinks blood vessels).  Therefore, if vasoconstriction occurs to these microscopic blood vessels they essentially disappear and are unable to deliver necessary nutrients to the healing tissue.  This is one modifiable factor that you can do to dramatically increase your chances of healing your injury.    (This means no smoking, no nicotine gum, patches, etc)  DO NOT USE NONSTEROIDAL ANTI-INFLAMMATORY DRUGS (NSAID'S)  Using products such as Advil (ibuprofen), Aleve (naproxen), Motrin (ibuprofen) for additional pain control during fracture healing can delay and/or prevent the healing response.  If you would like to take over the counter (OTC) medication, Tylenol (acetaminophen) is ok.  However, some narcotic medications that are given for pain control contain acetaminophen as well. Therefore, you should not exceed more than 4000 mg of tylenol in a day if you do not have liver disease.  Also note that there are may OTC medicines, such as cold medicines and allergy medicines that my contain tylenol as well.  If you have any questions about medications and/or interactions please ask your doctor/PA or your pharmacist.      ICE AND ELEVATE INJURED/OPERATIVE EXTREMITY  Using ice and elevating the injured extremity above your heart can help with swelling and pain control.  Icing in a pulsatile fashion, such as 20 minutes on and 20 minutes off, can be followed.    Do not place ice  directly on skin. Make sure there is a barrier between to skin and the ice pack.    Using frozen items such as frozen peas works well as the conform nicely to the are that needs to be iced.  USE AN ACE WRAP OR TED HOSE FOR SWELLING CONTROL  In addition to icing and elevation, Ace wraps or TED hose are used to help limit and resolve swelling.  It is recommended to use Ace wraps or TED hose until you are informed to stop.    When using Ace Wraps start the wrapping distally (farthest away from the body) and wrap proximally (  closer to the body)   Example: If you had surgery on your leg or thing and you do not have a splint on, start the ace wrap at the toes and work your way up to the thigh        If you had surgery on your upper extremity and do not have a splint on, start the ace wrap at your fingers and work your way up to the upper arm  IF YOU ARE IN A SPLINT OR CAST DO NOT [place]   If your splint gets wet for any reason please contact the office immediately. You may shower in your splint or cast as long as you keep it dry.  This can be done by wrapping in a cast cover or garbage back (or similar)  Do Not stick any thing down your splint or cast such as pencils, money, or hangers to try and scratch yourself with.  If you feel itchy take benadryl as prescribed on the bottle for itching  IF YOU ARE IN A CAM BOOT (BLACK BOOT)  You may remove boot periodically. Perform daily dressing changes as noted below.  Wash the liner of the boot regularly and wear a sock when wearing the boot. It is recommended that you sleep in the boot until told otherwise    Call office for the following:  Temperature greater than 101F  Persistent nausea and vomiting  Severe uncontrolled pain  Redness, tenderness, or signs of infection (pain, swelling, redness, odor or green/yellow discharge around the site)  Difficulty breathing, headache or visual disturbances  Hives  Persistent dizziness or  light-headedness  Extreme fatigue  Any other questions or concerns you may have after discharge  In an emergency, call 911 or go to an Emergency Department at a nearby hospital    [place]: [phone]   VISIT OUR WEBSITE FOR ADDITIONAL INFORMATION: [email or web address]
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
## 2019-02-26 ENCOUNTER — Encounter (HOSPITAL_COMMUNITY): Payer: Self-pay | Admitting: Orthopedic Surgery
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
## 2020-08-11 IMAGING — DX DG SHOULDER 1V*L*
2 series · 2 of 2 positions shown · non-contrast
Comparison: 02/14/2019

CLINICAL DATA: Postoperative

EXAM:
LEFT SHOULDER - 1 VIEW

[shoulder ap (1 of 2)]
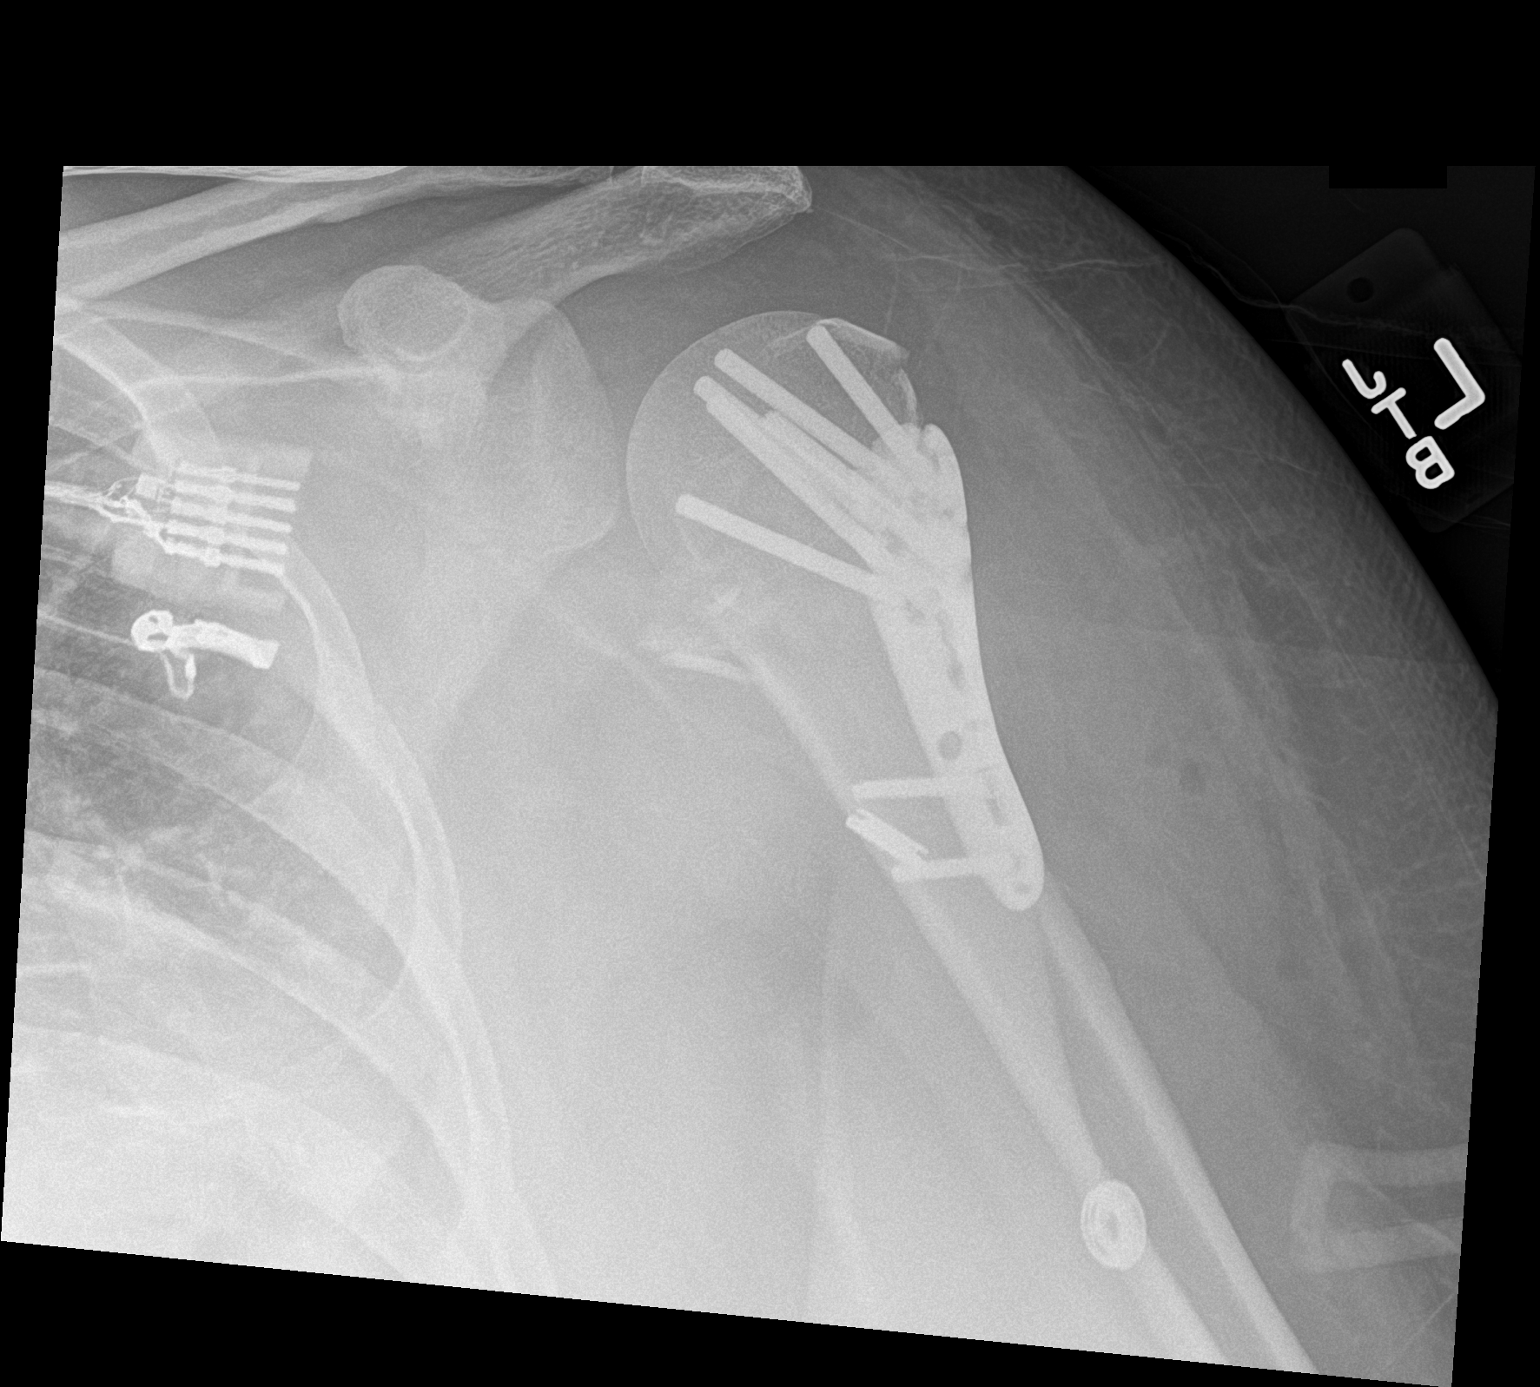

[shoulder ap (2 of 2)]
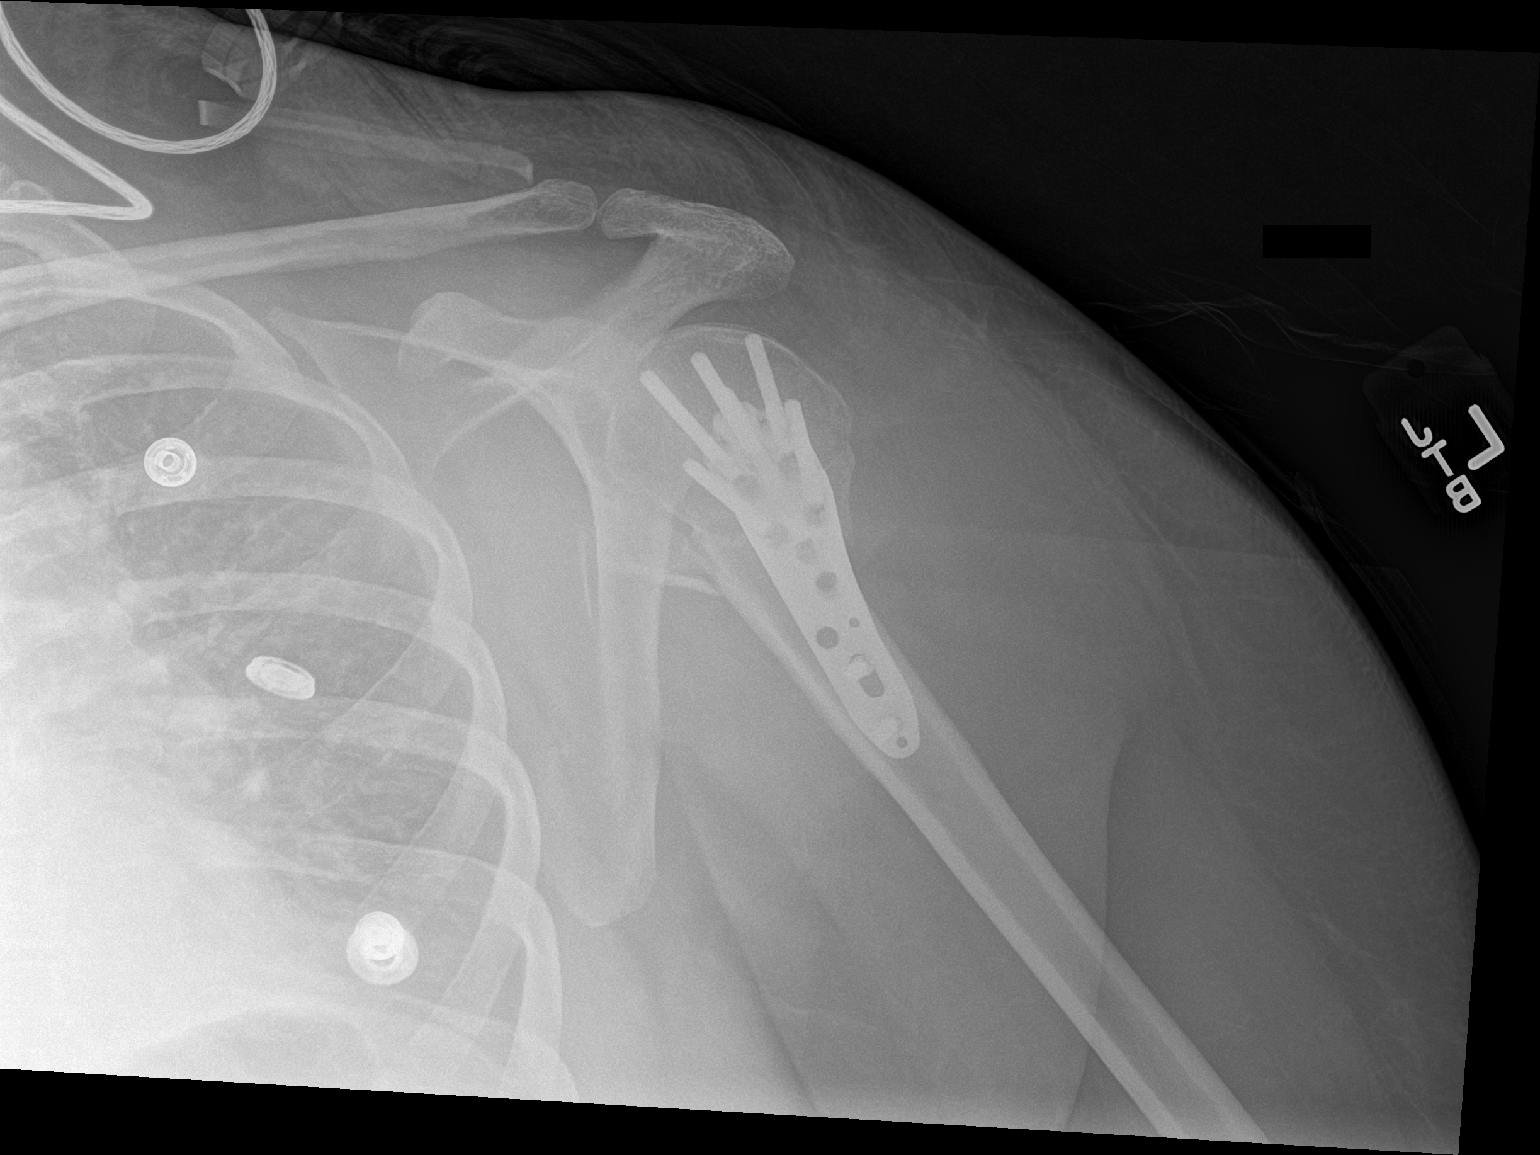

[2 of 2 positions shown; findings below may reference images not displayed]

FINDINGS: Plate and screw fixation of the proximal left humerus. The
glenohumeral joint is in anatomic position. There is a large joint
effusion. The acromioclavicular joint and partially imaged left
chest are unremarkable.
IMPRESSION: Plate and screw fixation of the proximal left humerus. The
glenohumeral joint is in anatomic position. There is a large joint
effusion.

## 2020-08-11 IMAGING — RF DG C-ARM 1-60 MIN
1 series · 2 of 2 positions shown · non-contrast
Comparison: No prior.

CLINICAL DATA: ORIF.

EXAM:
LEFT HUMERUS - 2+ VIEW; DG C-ARM 1-60 MIN

[Series 1: run · 2 of 2 slices shown]
[im 1/2]
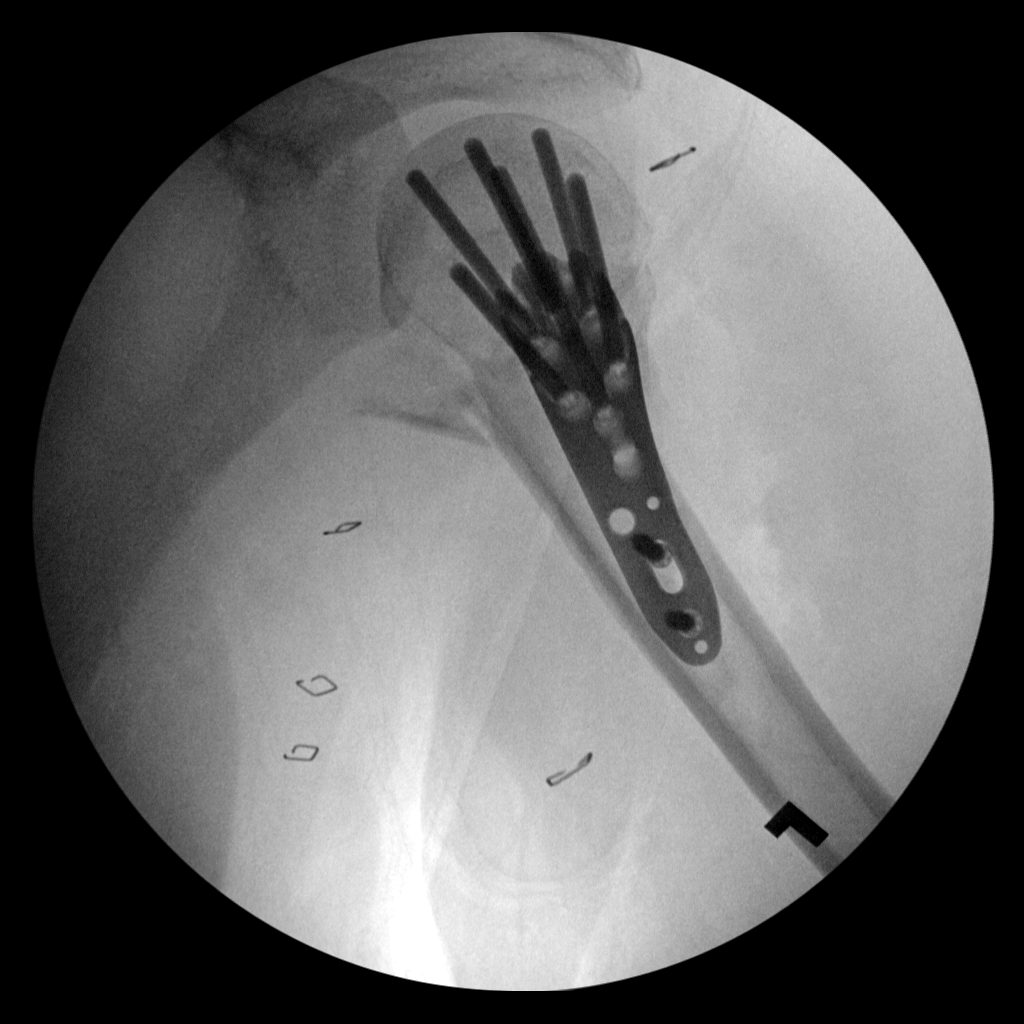
[im 2/2]
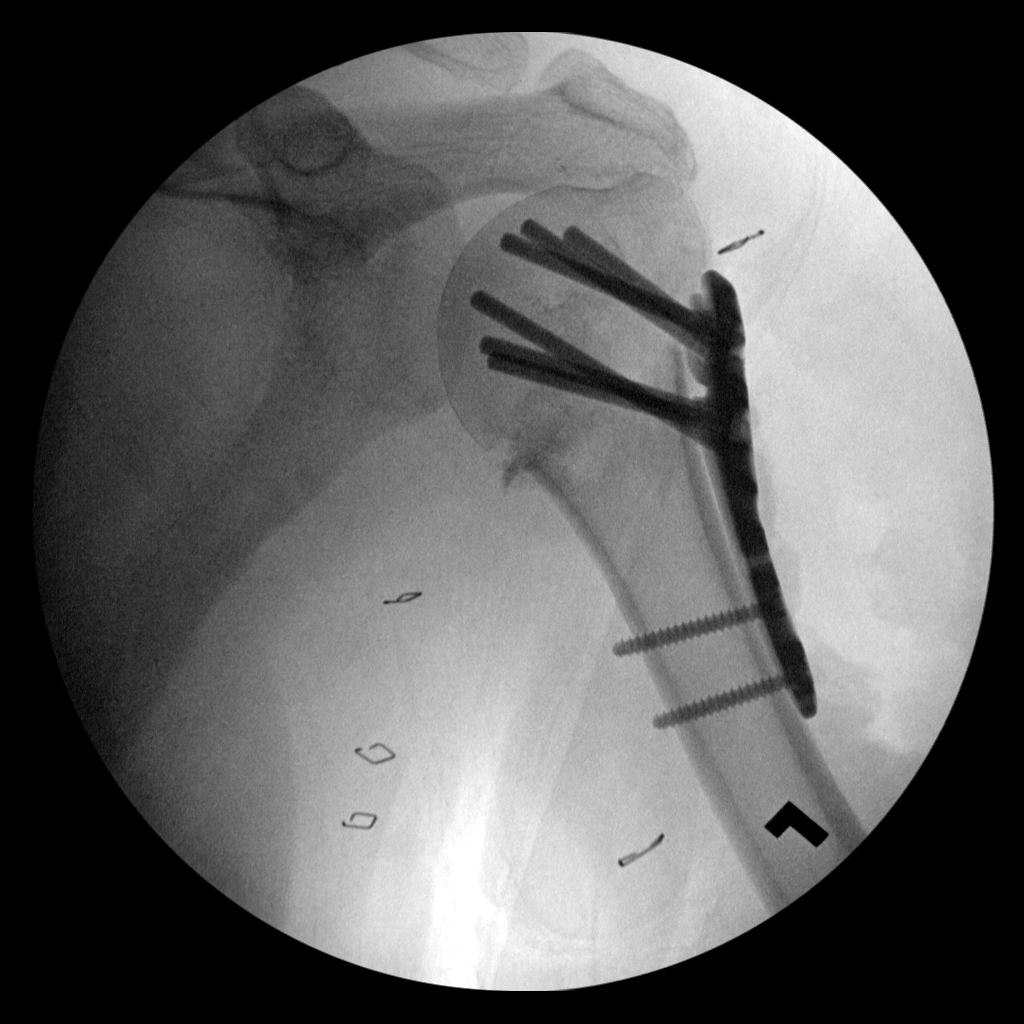

[2 of 2 positions shown; findings below may reference images not displayed]

FINDINGS: ORIF proximal left humerus.  Hardware intact.  Anatomic alignment.
IMPRESSION: ORIF proximal left humerus with anatomic alignment.

## 2020-08-11 IMAGING — RF DG HUMERUS 2V *L*
1 series · 2 of 2 positions shown · non-contrast
Comparison: No prior.

CLINICAL DATA: ORIF.

EXAM:
LEFT HUMERUS - 2+ VIEW; DG C-ARM 1-60 MIN

[Series 1: run · 2 of 2 slices shown]
[im 1/2]
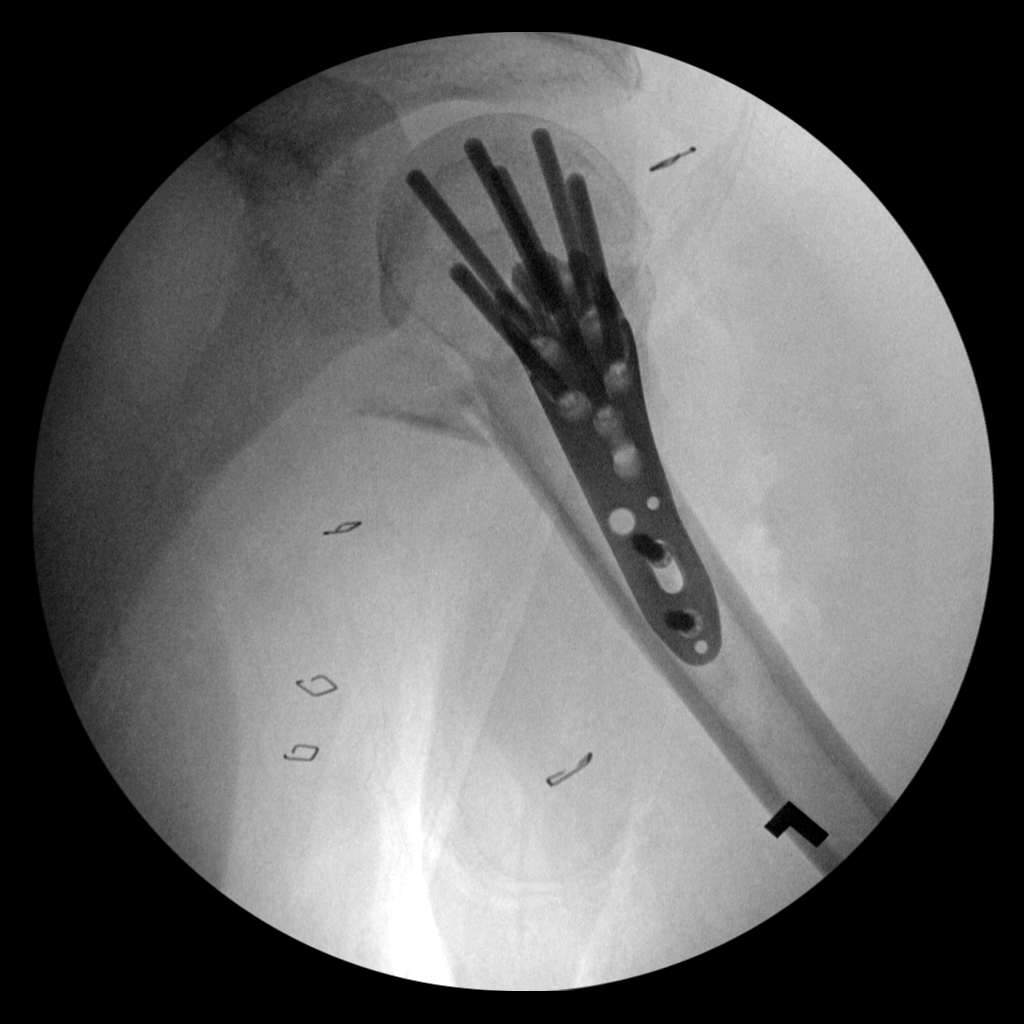
[im 2/2]
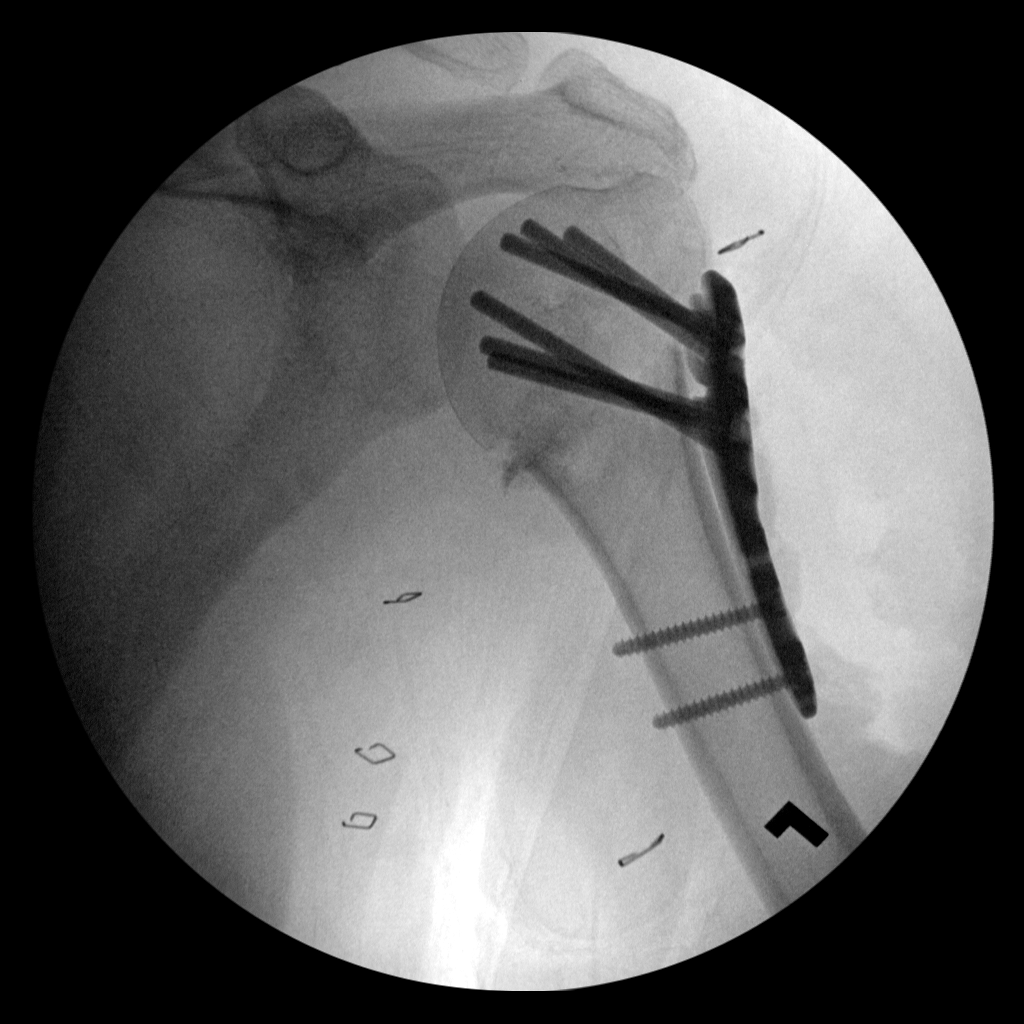

[2 of 2 positions shown; findings below may reference images not displayed]

FINDINGS: ORIF proximal left humerus.  Hardware intact.  Anatomic alignment.
IMPRESSION: ORIF proximal left humerus with anatomic alignment.
